# Patient Record
Sex: Male | Born: 1970 | Race: White | Hispanic: No | Marital: Married | Smoking: Never smoker
Health system: Southern US, Community
[De-identification: ages and names within clinical notes are randomized; demographics above are authoritative.]

## PROBLEM LIST (undated history)

## (undated) DIAGNOSIS — K219 Gastro-esophageal reflux disease without esophagitis: Secondary | ICD-10-CM

## (undated) DIAGNOSIS — Z87442 Personal history of urinary calculi: Secondary | ICD-10-CM

## (undated) DIAGNOSIS — I10 Essential (primary) hypertension: Secondary | ICD-10-CM

## (undated) DIAGNOSIS — N189 Chronic kidney disease, unspecified: Secondary | ICD-10-CM

## (undated) HISTORY — PX: THROAT SURGERY: SHX803

## (undated) HISTORY — DX: Essential (primary) hypertension: I10

---

## 2006-07-06 ENCOUNTER — Ambulatory Visit: Payer: Self-pay | Admitting: Family Medicine

## 2012-07-19 ENCOUNTER — Ambulatory Visit: Payer: Self-pay | Admitting: Otolaryngology

## 2012-07-20 LAB — PATHOLOGY REPORT

## 2012-08-23 ENCOUNTER — Ambulatory Visit: Payer: Self-pay | Admitting: Otolaryngology

## 2012-08-24 LAB — PATHOLOGY REPORT

## 2012-09-30 ENCOUNTER — Encounter: Payer: Self-pay | Admitting: Otolaryngology

## 2012-10-01 ENCOUNTER — Encounter: Payer: Self-pay | Admitting: Otolaryngology

## 2014-06-23 NOTE — Op Note (Signed)
PATIENT NAME:  Lowella PettiesBLALOCK, Divon T MR#:  [id number] DATE OF BIRTH:  09/18/1970  DATE OF PROCEDURE:  08/23/2012  PREOPERATIVE DIAGNOSIS: Laryngeal papillomas.   POSTOPERATIVE DIAGNOSIS: Laryngeal papillomas.   PROCEDURE PERFORMED:  Direct microlaryngoscopy and excision of laryngeal papillomas.   SURGEON: Cammy CopaPaul H. Kasmira Cacioppo, MD   ANESTHESIA: General.   COMPLICATIONS: None.   DESCRIPTION OF PROCEDURE: The patient was given general anesthesia by oral endotracheal intubation. A laser tube was placed. Once the patient was asleep, a laser Dedo laryngoscope was used for visualizing the hypopharynx and larynx. I was able to see the lower portion of the cords, but I was unable to get into the upper portion of the cords with the scope. The scope was removed and a Hollinger anterior commissure scope was then used for visualization of the anterior cords.  Because this was too narrow a scope, the laser was not used. The Louie arm was used for stabilization of the scope and then the microscope was brought in for high-power magnification. I could only use this with uniocular vision.   The right cord showed evidence of some scarring and healing tissue from removal of papillomas from the right cord 5 weeks ago. You could see papillomas on the left cord and some at the anterior commissure. The up-biting microcup forceps were used for excision of the papillomas on the left true cord. I took pictures of this prior to removal.  I first removed the ones that were easily seen. The cottonoid pledgets soaked in phenylephrine 1:1000 were used for vasoconstriction here in wiping away any blood staining. There was minimal bleeding, but slight ooze after biopsying the areas. Once it was vasoconstricted, the endoscope was used for visualizing the areas better. I could see still a little bit at the anterior commissure and then clean this out further. I used cottonoid pledgets again with phenylephrine for vasoconstriction for cleaning  this out and then looked again and could not find any further evidence of papillomas at the anterior commissure. The patient tolerated the procedure well. The scope was removed. There were no operative complications. Total estimated blood loss was minimal.   ____________________________ Cammy CopaPaul H. Zuzanna Maroney, MD phj:sb D: 08/23/2012 12:01:25 ET T: 08/23/2012 12:08:18 ET JOB#: [id number]  cc: Cammy CopaPaul H. Cherith Tewell, MD, <Dictator> Cammy CopaPAUL H Jodell Weitman MD ELECTRONICALLY SIGNED 08/23/2012 18:52

## 2014-06-23 NOTE — Op Note (Signed)
PATIENT NAME:  Isaac [name], Isaac [name] MR#:  [name] DATE OF BIRTH:  October 23, 1970  DATE OF PROCEDURE:  07/19/2012  PREOPERATIVE DIAGNOSIS: Right vocal cord lesions.  POSTOPERATIVE DIAGNOSES:  1.  Right vocal cord lesions, probable papilloma. 2.  Minimal left vocal cord lesions, probable papilloma.   OPERATIVE PROCEDURE: Direct microlaryngoscopy with biopsy and laser excision of the left vocal cord lesions.   SURGEON: Cammy CopaPaul H. Mayar [name], M.D.   ANESTHESIA: General.   COMPLICATIONS: None.   TOTAL ESTIMATED BLOOD LOSS: Minimal.   DESCRIPTION OF PROCEDURE:  The patient was given general anesthesia by oral endotracheal intubation. A Laser tube was used which is metal. Once the patient is asleep, placed in the spine position, his head is extended slightly and a Dedo laryngoscope was used for visualizing the hypopharynx and larynx. An upper tooth guard is placed as he has very good teeth. The Dedo scope is placed where the epiglottis is lifted upwards down to the level of the anterior commissure to be able to see the vocal cords. This was pretty tight to get into this area. A Louie Arm was used for stabilization. Microscope was brought in for high-power magnification and you could see what looked like papillomatous growths on the right vocal cord. There were a couple of small ones on the left anterior vocal cords just beyond the anterior commissure.  The right vocal cord lesions were at least twice the size and extended further posteriorly. A rigid 0 degree endoscope was used for better visualization, but I used a 30 degree scope to get even a better angle at the anterior commissure to take some pictures. The endoscope was then removed, microscope hooked back up.  Cup-biting forceps were used for biopsying and stripping some of the mucosa off of the anterior cord on the right side. The laser was set at 3 watts of power and 0.1 second interval mode.  This was tested to measure alignment of the HeNe beam. This  was correct. The laser was then brought in and the laser used along the anterior and midportion of the cords where the mucosa was stripped and clean and any rough edges along the mid anterior cord. This removed all aspects of the mucosa where there was any evidence of papilloma at the time. The muscle is left intact underneath. There is still some papilloma-appearing growth on the left anterior cord, however, this is not removed so as to prevent web formation. Bleeding was controlled with Cottonoid pledgets soaked in phenylephrine and Xylocaine. A wet Cottonoid that was soaked in saline was placed down over the laser tube and cuff to make sure that scatter did not hit these areas. Separate suction was used for suctioning out the vapor.  The patient tolerated the procedure very well. Once the papillomas were all removed from the right side, pictures were again taken with the endoscope for verification.   The patient was awakened and taken to the recovery room in satisfactory condition. There were no operative complications. Total estimated blood loss was minimal.  ____________________________ Cammy CopaPaul H. Isaac Dorsch, MD phj:sb D: 07/19/2012 13:37:20 ET [name]: 07/19/2012 14:19:28 ET JOB#: [id number]  cc: Cammy CopaPaul H. Isaac Rao, MD, <Dictator> Cammy CopaPAUL H Isaac Remick MD ELECTRONICALLY SIGNED 07/19/2012 19:46

## 2017-10-15 ENCOUNTER — Ambulatory Visit
Admission: EM | Admit: 2017-10-15 | Discharge: 2017-10-15 | Disposition: A | Discharge status: Home / Self Care | Payer: Self-pay | Attending: Family Medicine | Admitting: Family Medicine

## 2017-10-15 ENCOUNTER — Other Ambulatory Visit: Payer: Self-pay

## 2017-10-15 ENCOUNTER — Ambulatory Visit (INDEPENDENT_AMBULATORY_CARE_PROVIDER_SITE_OTHER): Payer: Self-pay

## 2017-10-15 ENCOUNTER — Encounter: Payer: Self-pay | Admitting: Emergency Medicine

## 2017-10-15 DIAGNOSIS — S76111A Strain of right quadriceps muscle, fascia and tendon, initial encounter: Secondary | ICD-10-CM

## 2017-10-15 MED ORDER — MUPIROCIN 2 % EX OINT: 1.0000 "application " | TOPICAL_OINTMENT | Freq: Three times a day (TID) | CUTANEOUS | 0 refills | Status: DC

## 2017-10-15 MED ORDER — NAPROXEN 500 MG PO TABS: 500.0000 mg | ORAL_TABLET | Freq: Two times a day (BID) | ORAL | 0 refills | Status: DC

## 2017-10-15 NOTE — ED Triage Notes (Signed)
Patient c/o right knee pain and swelling that started yesterday.  Patient denies fall or injury.

## 2017-10-15 NOTE — Discharge Instructions (Addendum)
Apply ice 20 minutes out of every 2 hours 4-5 times daily for comfort.  Use the knee immobilizer for all activities.  If not Improving in 1 to 2 weeks follow-up with orthopedic surgery.  Perform quadriceps strengthening exercises shown to you ,while wearing the brace.

## 2017-10-15 NOTE — ED Provider Notes (Signed)
[facility]    CSN: [id number] Arrival date & time: 10/15/17  1945     History   Chief Complaint Chief Complaint  Patient presents with  . Knee Pain    HPI REFUJIO HAYMER is a 47 y.o. male.   HPI  47 year old male accompanied by his wife presents complaining of right knee pain and swelling that started yesterday.  Able to localize the pain to the superior pole of the patella into the quadriceps tendon.  States that it hurts to bend the knee in extreme flexion also to take his body weight against gravity.  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  Able to take his knee through range of motion comfortably.  Does have warmth over the patella superiorly.  No popping locking or clicking.  Had no fever or chills.        History reviewed. No pertinent past medical history.  There are no active problems to display for this patient.   Past Surgical History:  Procedure Laterality Date  . THROAT SURGERY         Home Medications    Prior to Admission medications   Medication Sig Start Date End Date Taking? Authorizing Provider  mupirocin ointment (BACTROBAN) 2 % Apply 1 application topically 3 (three) times daily. 10/15/17   Lutricia Feil, PA-C  naproxen (NAPROSYN) 500 MG tablet Take 1 tablet (500 mg total) by mouth 2 (two) times daily with a meal. 10/15/17   Lutricia Feil, PA-C    Family History History reviewed. No pertinent family history.  Social History Social History   Tobacco Use  . Smoking status: Never Smoker  Substance Use Topics  . Alcohol use: Not Currently  . Drug use: Never     Allergies   Patient has no known allergies.   Review of Systems Review of Systems  Constitutional: Positive for activity change. Negative for appetite change, chills, fatigue and fever.  Musculoskeletal: Positive for gait problem and  myalgias.  All other systems reviewed and are negative.    Physical Exam Triage Vital Signs ED Triage Vitals  Enc Vitals Group     BP 10/15/17 1958 (!) 152/111     Pulse Rate 10/15/17 1958 86     Resp 10/15/17 1958 16     Temp 10/15/17 1958 98.2 F (36.8 C)     Temp Source 10/15/17 1958 Oral     SpO2 10/15/17 1958 98 %     Weight 10/15/17 1955 230 lb (104.3 kg)     Height 10/15/17 1955 5\' 10"  (1.778 m)     Head Circumference --      Peak Flow --      Pain Score 10/15/17 1955 8     Pain Loc --      Pain Edu? --      Excl. in GC? --    No data found.  Updated Vital Signs BP (!) 152/111 (BP Location: Left Arm)   Pulse 86   Temp 98.2 F (36.8 C) (Oral)   Resp 16   Ht 5\' 10"  (1.778 m)   Wt 230 lb (104.3 kg)   SpO2 98%   BMI 33.00 kg/m   Visual Acuity Right Eye Distance:   Left Eye Distance:   Bilateral Distance:    Right Eye Near:   Left Eye Near:    Bilateral  Near:     Physical Exam  Constitutional: He appears well-developed and well-nourished. No distress.  HENT:  Head: Normocephalic.  Eyes: Pupils are equal, round, and reactive to light. Right eye exhibits no discharge. Left eye exhibits no discharge.  Neck: Normal range of motion.  Musculoskeletal: He exhibits tenderness.  Exam of the right knee shows a full extension and flexion to 120 degrees.  There is no effusion.  Is very warm over the superior pole of the patella.  No noted pain with manipulation of the patella no retropatellar tenderness.  Pain is sharply localized and is exquisite over the insertion of the quadriceps tendon into the superior pole the patella.  He has no joint line tenderness.  Lateral collateral ligaments collateral ligament are intact to stressing.  He is a negative anterior drawer sign.  He does have a large calcification over the tibial tubercle from previous bursitis according to the patient.  Does have good quad control  Skin: He is not diaphoretic.  Nursing note and vitals  reviewed.    UC Treatments / Results  Labs (all labs ordered are listed, but only abnormal results are displayed) Labs Reviewed - No data to display  EKG None  Radiology Dg Knee Complete 4 Views Right  Result Date: 10/15/2017 CLINICAL DATA:  Right knee pain and swelling for 1 day. No known injury. EXAM: RIGHT KNEE - COMPLETE 4+ VIEW COMPARISON:  None. FINDINGS: No evidence of fracture, dislocation, or joint effusion. No evidence of arthropathy or other focal bone abnormality. Soft tissues are unremarkable. IMPRESSION: Negative. Electronically Signed   By: Myles RosenthalJohn  Stahl M.D.   On: 10/15/2017 20:20    Procedures Procedures (including critical care time)  Medications Ordered in UC Medications - No data to display  Initial Impression / Assessment and Plan / UC Course  I have reviewed the triage vital signs and the nursing notes.  Pertinent labs & imaging results that were available during my care of the patient were reviewed by me and considered in my medical decision making (see chart for details).     Plan: 1. Test/x-ray results and diagnosis reviewed with patient 2. rx as per orders; risks, benefits, potential side effects reviewed with patient 3. Recommend supportive treatment with ice and elevation.  Rest and symptom avoidance.  Avoid taking body weight against gravity.  Patient was given a knee immobilizer to wear for activities.  Come out of it for quiet times.  Naprosyn twice daily with food.  If he is not improving he should follow-up with orthopedics in 1 to 2 weeks 4. F/u prn if symptoms worsen or don't improve  Final Clinical Impressions(s) / UC Diagnoses   Final diagnoses:  Strain of right quadriceps tendon, initial encounter     Discharge Instructions     Apply ice 20 minutes out of every 2 hours 4-5 times daily for comfort.  Use the knee immobilizer for all activities.  If not Improving in 1 to 2 weeks follow-up with orthopedic surgery.  Perform quadriceps  strengthening exercises shown to you ,while wearing the brace.    ED Prescriptions    Medication Sig Dispense Auth. Provider   naproxen (NAPROSYN) 500 MG tablet Take 1 tablet (500 mg total) by mouth 2 (two) times daily with a meal. 60 tablet Ovid Curdoemer, Coburn Knaus P, PA-C   mupirocin ointment (BACTROBAN) 2 % Apply 1 application topically 3 (three) times daily. 22 g Lutricia Feiloemer, Shadi Larner P, PA-C     Controlled Substance Prescriptions Industry Controlled Substance Registry  consulted? Not Applicable   Lutricia FeilRoemer, Redmond Whittley P, PA-C 10/15/17 2049

## 2018-08-30 ENCOUNTER — Encounter: Payer: Self-pay | Admitting: Emergency Medicine

## 2018-08-30 ENCOUNTER — Ambulatory Visit
Admission: EM | Admit: 2018-08-30 | Discharge: 2018-08-30 | Disposition: A | Discharge status: Other Acute Inpatient Hospital | Payer: Self-pay | Attending: Emergency Medicine | Admitting: Emergency Medicine

## 2018-08-30 ENCOUNTER — Other Ambulatory Visit: Payer: Self-pay

## 2018-08-30 DIAGNOSIS — I160 Hypertensive urgency: Secondary | ICD-10-CM | POA: Insufficient documentation

## 2018-08-30 LAB — BASIC METABOLIC PANEL
Anion gap: 9 (ref 5–15)
BUN: 21 mg/dL — ABNORMAL HIGH (ref 6–20)
CO2: 20 mmol/L — ABNORMAL LOW (ref 22–32)
Calcium: 9.7 mg/dL (ref 8.9–10.3)
Chloride: 107 mmol/L (ref 98–111)
Creatinine, Ser: 1.47 mg/dL — ABNORMAL HIGH (ref 0.61–1.24)
GFR calc Af Amer: >60 mL/min (ref >60)
GFR calc non Af Amer: 56 mL/min — ABNORMAL LOW (ref >60)
Glucose, Bld: 104 mg/dL — ABNORMAL HIGH (ref 70–99)
Potassium: 4.2 mmol/L (ref 3.5–5.1)
Sodium: 136 mmol/L (ref 135–145)

## 2018-08-30 MED ORDER — CLONIDINE HCL 0.2 MG PO TABS
0.2000 mg | ORAL_TABLET | Freq: Once | ORAL | Status: AC
  Administered 2018-08-30: 0.2 mg via ORAL

## 2018-08-30 NOTE — Discharge Instructions (Signed)
Go directly to the emergency room for further evaluation as discussed.

## 2018-08-30 NOTE — ED Provider Notes (Addendum)
[facility] ____________________________________________  Time seen: Approximately 12:34 PM  I have reviewed the triage vital signs and the nursing notes.   HISTORY  Chief Complaint Hypertension   HPI Isaac [name] is a 48 y.o. male presenting for evaluation of elevated blood pressure.  Patient reports yesterday while at work he did not feel well, stating he was tired.  States today he had an episode of similar that lasted briefly and his friend checked his blood pressure and it was 145/100.  States at this time he feels well.  Denies any accompanying chest pain, shortness of breath, dizziness, paresthesias, headache, vision changes, extremity swelling or other changes.  Denies recent cough, congestion, sore throat or fevers.  Reports he has not followed with a primary care on a regular basis.  States he is currently working multiple jobs and does have a lot of stress.  States 18 years ago he had similar with a lot of stress in his blood pressure at that time was more elevated than now and he had a stress test and work-up which was negative.  States he wants to make sure he gets his blood pressure under control.  Reports family history of hypertension.  Not a smoker.  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  States overall his diet is fairly healthy.  Not drinking a lot of alcohol.  Drinks mostly water.  Denies any pain at this time.  Reports feels well.  No PCP.   History reviewed. No pertinent past medical history.  There are no active problems to display for this patient.   Past Surgical History:  Procedure Laterality Date  . THROAT SURGERY       No current facility-administered medications for this encounter.   Current Outpatient Medications:  .  mupirocin ointment (BACTROBAN) 2 %, Apply 1 application topically 3 (three) times daily., Disp: 22 g, Rfl: 0 .  naproxen (NAPROSYN) 500 MG tablet, Take 1 tablet (500 mg total) by mouth 2 (two) times  daily with a meal., Disp: 60 tablet, Rfl: 0  Allergies Patient has no known allergies.  Family History  Problem Relation Age of Onset  . Stroke Mother   . Hypertension Father   . Stroke Father   . Diabetes Father   Sister: HTN Father: MI, first at age 49 Father deceased.   Social History Social History   Tobacco Use  . Smoking status: Never Smoker  . Smokeless tobacco: Never Used  Substance Use Topics  . Alcohol use: Not Currently  . Drug use: Never    Review of Systems Constitutional: No fever Eyes: No visual changes. ENT: No sore throat. Cardiovascular: Denies chest pain. Respiratory: Denies shortness of breath. Gastrointestinal: No abdominal pain.  No nausea, no vomiting.  No diarrhea.  No constipation. Genitourinary: Negative for dysuria. Musculoskeletal: Negative for back pain. Skin: Negative for rash. Neurological: Negative for headaches, focal weakness or numbness.  ____________________________________________   PHYSICAL EXAM:  VITAL SIGNS: ED Triage Vitals  Enc Vitals Group     BP 08/30/18 1201 (!) 170/123     Pulse Rate 08/30/18 1201 81     Resp 08/30/18 1201 16     Temp 08/30/18 1201 99 F (37.2 C)     Temp Source 08/30/18 1201 Oral     SpO2 08/30/18 1201 100 %     Weight 08/30/18 1158 235 lb (106.6 kg)     Height 08/30/18 1158 5\' 9"  (1.753 m)     Head Circumference --  Peak Flow --      Pain Score 08/30/18 1157 0     Pain Loc --      Pain Edu? --      Excl. in GC? --    Vitals:   08/30/18 1158 08/30/18 1201 08/30/18 1328  BP:  (!) 170/123 140/90  Pulse:  81   Resp:  16   Temp:  99 F (37.2 C)   TempSrc:  Oral   SpO2:  100%   Weight: 235 lb (106.6 kg)    Height: 5\' 9"  (1.753 m)      Constitutional: Alert and oriented. Well appearing and in no acute distress. Eyes: Conjunctivae are normal.  ENT      Head: Normocephalic and atraumatic. Cardiovascular: Normal rate, regular rhythm. Grossly normal heart sounds.  Good peripheral  circulation. Respiratory: Normal respiratory effort without tachypnea nor retractions. Breath sounds are clear and equal bilaterally. No wheezes, rales, rhonchi. Musculoskeletal:  Steady gait.  No extremity edema noted. Neurologic:  Normal speech and language. No gross focal neurologic deficits are appreciated. Speech is normal. No gait instability.  Skin:  Skin is warm, dry and intact. No rash noted. Psychiatric: Mood and affect are normal. Speech and behavior are normal. Patient exhibits appropriate insight and judgment   ___________________________________________   LABS (all labs ordered are listed, but only abnormal results are displayed)  Labs Reviewed  BASIC METABOLIC PANEL - Abnormal; Notable for the following components:      Result Value   CO2 20 (*)    Glucose, Bld 104 (*)    BUN 21 (*)    Creatinine, Ser 1.47 (*)    GFR calc non Af Amer 56 (*)    All other components within normal limits   ECG ED ECG REPORT I, Renford DillsLindsey Mariska Daffin, the attending provider, personally viewed and interpreted this ECG.   Date: 08/30/2018  EKG Time: 1334  Rate: 68  Rhythm: normal sinus rhythm  Axis: normal  Intervals:none  ST&T Change: none   PROCEDURES Procedures    INITIAL IMPRESSION / ASSESSMENT AND PLAN / ED COURSE  Pertinent labs & imaging results that were available during my care of the patient were reviewed by me and considered in my medical decision making (see chart for details).  Well-appearing patient.  No acute distress.  Patient does report he is somewhat anxious currently about being in the doctor's office.  Denies chest pain, shortness of breath, headache or other complaints at this time.  Clonidine 0.2 mg given once and will evaluate BMP.  Patient resting calmly in room.  Blood pressure did improve with clonidine.  However BMP was evaluated and noted to have a creatinine elevation at 1.47.  No baseline available for comparison.  EKG evaluated.  As patient with  hypertension and possible organ involvement, hypertensive urgency and recommend further evaluation in emergency room at this time.  Patient with significant family risk factors.  Patient verbalized understanding agreed this plan, and states that his sister will [facility].  Patient agreed to plan.  Did further obtain outpatient follow-up appointment with [facility] on July 9 information was given by [facility] CMA for appointment.  ____________________________________________   FINAL CLINICAL IMPRESSION(S) / ED DIAGNOSES  Final diagnoses:  Hypertensive urgency     ED Discharge Orders    None       Note: This dictation was prepared with Dragon dictation along with smaller phrase technology. Any transcriptional errors that result from  this process are unintentional.         Marylene Land, NP 08/30/18 1405

## 2018-08-30 NOTE — ED Triage Notes (Signed)
Patient states that his BP had been elevated and when he checked it today it was 145/100.  Patient denies HAs.

## 2018-09-09 ENCOUNTER — Other Ambulatory Visit: Payer: Self-pay

## 2018-09-09 ENCOUNTER — Encounter: Payer: Self-pay | Admitting: Family Medicine

## 2018-09-09 ENCOUNTER — Ambulatory Visit (INDEPENDENT_AMBULATORY_CARE_PROVIDER_SITE_OTHER): Payer: Self-pay | Admitting: Family Medicine

## 2018-09-09 VITALS — BP 164/98 | HR 80 | Ht 69.0 in | Wt 228.0 lb

## 2018-09-09 DIAGNOSIS — I10 Essential (primary) hypertension: Secondary | ICD-10-CM

## 2018-09-09 DIAGNOSIS — E6609 Other obesity due to excess calories: Secondary | ICD-10-CM

## 2018-09-09 DIAGNOSIS — Z6833 Body mass index (BMI) 33.0-33.9, adult: Secondary | ICD-10-CM

## 2018-09-09 DIAGNOSIS — Z7689 Persons encountering health services in other specified circumstances: Secondary | ICD-10-CM

## 2018-09-09 MED ORDER — AMLODIPINE BESYLATE 2.5 MG PO TABS: 2.5000 mg | ORAL_TABLET | Freq: Every day | ORAL | 1 refills | Status: DC

## 2018-09-09 NOTE — Progress Notes (Signed)
Date:  09/09/2018   Name:  Isaac PettiesJonathan T [name]   DOB:  Apr 24, 1970   MRN:  [id number]   Chief Complaint: Establish Care (no PCP) and Hypertension (episode occured last monday- happened 18 years ago. dx as stress related because "everything showed up fine")  Hypertension This is a chronic problem. The current episode started more than 1 year ago. The problem has been waxing and waning since onset. The problem is uncontrolled. Pertinent negatives include no anxiety, blurred vision, chest pain, headaches, malaise/fatigue, neck pain, orthopnea, palpitations, peripheral edema, PND, shortness of breath or sweats. There are no associated agents to hypertension. There are no known risk factors for coronary artery disease. Past treatments include nothing. The current treatment provides moderate improvement. There are no compliance problems.  There is no history of angina, kidney disease, CAD/MI, CVA, heart failure, left ventricular hypertrophy, PVD or retinopathy. There is no history of chronic renal disease, a hypertension causing med or renovascular disease.    Review of Systems  Constitutional: Negative for chills, fever and malaise/fatigue.  HENT: Negative for drooling, ear discharge, ear pain and sore throat.   Eyes: Negative for blurred vision.  Respiratory: Negative for cough, shortness of breath and wheezing.   Cardiovascular: Negative for chest pain, palpitations, orthopnea, leg swelling and PND.  Gastrointestinal: Negative for abdominal pain, blood in stool, constipation, diarrhea and nausea.  Endocrine: Negative for polydipsia.  Genitourinary: Negative for dysuria, frequency, hematuria and urgency.  Musculoskeletal: Negative for back pain, myalgias and neck pain.  Skin: Negative for rash.  Allergic/Immunologic: Negative for environmental allergies.  Neurological: Negative for dizziness and headaches.  Hematological: Does not bruise/bleed easily.  Psychiatric/Behavioral: Negative for  suicidal ideas. The patient is not nervous/anxious.     There are no active problems to display for this patient.   No Known Allergies  Past Surgical History:  Procedure Laterality Date  . THROAT SURGERY      Social History   Tobacco Use  . Smoking status: Never Smoker  . Smokeless tobacco: Never Used  Substance Use Topics  . Alcohol use: Not Currently  . Drug use: Never     Medication list has been reviewed and updated.  No outpatient medications have been marked as taking for the 09/09/18 encounter (Office Visit) with Duanne LimerickJones, Wood Novacek C, MD.    [facility]2018  PHQ - 2 Score 0  PHQ- 9 Score 2    BP Readings from Last 3 Encounters:  09/09/18 (!) 164/98  08/30/18 140/90  10/15/17 (!) 152/111    Physical Exam Vitals signs and nursing note reviewed.  Constitutional:      General: He is not in acute distress.    Appearance: Normal appearance. He is normal weight. He is not ill-appearing, toxic-appearing or diaphoretic.  HENT:     Head: Normocephalic.     Right Ear: Tympanic membrane, ear canal and external ear normal.     Left Ear: Tympanic membrane, ear canal and external ear normal.     Nose: Nose normal. No congestion or rhinorrhea.     Mouth/Throat:     Mouth: Mucous membranes are moist.  Eyes:     General: No scleral icterus.       Right eye: No discharge.        Left eye: No discharge.     Conjunctiva/sclera: Conjunctivae normal.     Pupils: Pupils are equal, round, and reactive to light.  Neck:     Musculoskeletal: Normal range of motion and  neck supple.     Thyroid: No thyromegaly.     Vascular: No JVD.     Trachea: No tracheal deviation.  Cardiovascular:     Rate and Rhythm: Normal rate and regular rhythm.     Pulses: Normal pulses.     Heart sounds: Normal heart sounds. No murmur. No friction rub. No gallop.   Pulmonary:     Effort: No respiratory distress.     Breath sounds: Normal breath sounds. No stridor. No wheezing, rhonchi or  rales.  Chest:     Chest wall: No tenderness.  Abdominal:     General: Bowel sounds are normal.     Palpations: Abdomen is soft. There is no mass.     Tenderness: There is no abdominal tenderness. There is no guarding or rebound.  Musculoskeletal: Normal range of motion.        General: No tenderness.  Lymphadenopathy:     Cervical: No cervical adenopathy.  Skin:    General: Skin is warm.     Coloration: Skin is not jaundiced or pale.     Findings: No bruising, erythema, lesion or rash.  Neurological:     Mental Status: He is alert and oriented to person, place, and time.     Cranial Nerves: No cranial nerve deficit.     Deep Tendon Reflexes: Reflexes are normal and symmetric.     Wt Readings from Last 3 Encounters:  09/09/18 228 lb (103.4 kg)  08/30/18 235 lb (106.6 kg)  10/15/17 230 lb (104.3 kg)    BP (!) 164/98   Pulse 80   Ht 5\' 9"  (1.753 m)   Wt 228 lb (103.4 kg)   BMI 33.67 kg/m   Assessment and Plan: Patient has had multiple readings of elevated blood pressures on other visits.  Before these readings have been watched 1. Essential hypertension Patient has had multiple readings of elevated blood pressures on other basis.  Before these readings have been "watch ".  On a previous visit to the urgent care patient was noted to have elevated blood pressure reading which necessitated a ER evaluation.  Patient was noted to have some prerenal azotemia which was corrected with hydration.  We will initiate amlodipine 2.5 mg to see how patient tolerates and evaluate in 6 weeks.  In the meantime we will also incorporate a Dash diet and encourage avoidance of sodium in abundance. - amLODipine (NORVASC) 2.5 MG tablet; Take 1 tablet (2.5 mg total) by mouth daily.  Dispense: 30 tablet; Refill: 1  2. Class 1 obesity due to excess calories with serious comorbidity and body mass index (BMI) of 33.0 to 33.9 in adult Patient could use some weight loss as well and the DASH diet may serve  in this purpose and capacity as well.  3. Establishing care with new doctor, encounter for Patient establishing care with new physician for primary care needs.

## 2018-09-09 NOTE — Patient Instructions (Signed)

## 2018-10-21 ENCOUNTER — Encounter: Payer: Self-pay | Admitting: Family Medicine

## 2018-10-21 ENCOUNTER — Ambulatory Visit (INDEPENDENT_AMBULATORY_CARE_PROVIDER_SITE_OTHER): Payer: Self-pay | Admitting: Family Medicine

## 2018-10-21 ENCOUNTER — Other Ambulatory Visit: Payer: Self-pay

## 2018-10-21 VITALS — BP 136/82 | HR 76 | Ht 69.0 in | Wt 225.0 lb

## 2018-10-21 DIAGNOSIS — I10 Essential (primary) hypertension: Secondary | ICD-10-CM

## 2018-10-21 MED ORDER — AMLODIPINE BESYLATE 2.5 MG PO TABS: 2.5000 mg | ORAL_TABLET | Freq: Every day | ORAL | 1 refills | Status: DC

## 2018-10-21 NOTE — Progress Notes (Signed)
Date:  10/21/2018   Name:  Isaac [name]   DOB:  [birth year]   MRN:  [id number]   Chief Complaint: Hypertension (follow up starting Amlodipine)  Hypertension This is a chronic problem. The current episode started more than 1 year ago. The problem has been waxing and waning since onset. The problem is controlled. Pertinent negatives include no anxiety, blurred vision, chest pain, headaches, malaise/fatigue, neck pain, orthopnea, palpitations, peripheral edema, PND, shortness of breath or sweats. There are no associated agents to hypertension. There are no known risk factors for coronary artery disease. Past treatments include calcium channel blockers. The current treatment provides moderate improvement. There are no compliance problems.  There is no history of angina, kidney disease, CAD/MI, CVA, heart failure, left ventricular hypertrophy, PVD or retinopathy. There is no history of chronic renal disease, a hypertension causing med or renovascular disease.    Review of Systems  Constitutional: Negative for chills, fever and malaise/fatigue.  HENT: Negative for drooling, ear discharge, ear pain and sore throat.   Eyes: Negative for blurred vision.  Respiratory: Negative for cough, shortness of breath and wheezing.   Cardiovascular: Negative for chest pain, palpitations, orthopnea, leg swelling and PND.  Gastrointestinal: Negative for abdominal pain, blood in stool, constipation, diarrhea and nausea.  Endocrine: Negative for polydipsia.  Genitourinary: Negative for dysuria, frequency, hematuria and urgency.  Musculoskeletal: Negative for back pain, myalgias and neck pain.  Skin: Negative for rash.  Allergic/Immunologic: Negative for environmental allergies.  Neurological: Negative for dizziness and headaches.  Hematological: Does not bruise/bleed easily.  Psychiatric/Behavioral: Negative for suicidal ideas. The patient is not nervous/anxious.     There are no active problems to  display for this patient.   No Known Allergies  Past Surgical History:  Procedure Laterality Date  . THROAT SURGERY      Social History   Tobacco Use  . Smoking status: Never Smoker  . Smokeless tobacco: Never Used  Substance Use Topics  . Alcohol use: Not Currently  . Drug use: Never     Medication list has been reviewed and updated.  Current Meds  Medication Sig  . amLODipine (NORVASC) 2.5 MG tablet Take 1 tablet (2.5 mg total) by mouth daily.    PHQ 2/9 Scores 09/09/2018  PHQ - 2 Score 0  PHQ- 9 Score 2    BP Readings from Last 3 Encounters:  10/21/18 136/82  09/09/18 (!) 164/98  08/30/18 140/90    Physical Exam Vitals signs and nursing note reviewed.  HENT:     Head: Normocephalic.     Right Ear: Tympanic membrane, ear canal and external ear normal.     Left Ear: Tympanic membrane, ear canal and external ear normal.     Nose: Nose normal. No congestion or rhinorrhea.  Eyes:     General: No scleral icterus.       Right eye: No discharge.        Left eye: No discharge.     Conjunctiva/sclera: Conjunctivae normal.     Pupils: Pupils are equal, round, and reactive to light.  Neck:     Musculoskeletal: Normal range of motion and neck supple.     Thyroid: No thyromegaly.     Vascular: No JVD.     Trachea: No tracheal deviation.  Cardiovascular:     Rate and Rhythm: Normal rate and regular rhythm.     Heart sounds: Normal heart sounds. No murmur. No friction rub. No gallop.   Pulmonary:  Effort: No respiratory distress.     Breath sounds: Normal breath sounds. No wheezing, rhonchi or rales.  Abdominal:     General: Bowel sounds are normal.     Palpations: Abdomen is soft. There is no mass.     Tenderness: There is no abdominal tenderness. There is no guarding or rebound.  Musculoskeletal: Normal range of motion.        General: No tenderness.  Lymphadenopathy:     Cervical: No cervical adenopathy.  Skin:    General: Skin is warm.     Findings: No  rash.  Neurological:     Mental Status: He is alert and oriented to person, place, and time.     Cranial Nerves: No cranial nerve deficit.     Deep Tendon Reflexes: Reflexes are normal and symmetric.     Wt Readings from Last 3 Encounters:  10/21/18 225 lb (102.1 kg)  09/09/18 228 lb (103.4 kg)  08/30/18 235 lb (106.6 kg)    BP 136/82   Pulse 76   Ht 5\' 9"  (1.753 m)   Wt 225 lb (102.1 kg)   BMI 33.23 kg/m   Assessment and Plan: 1. Essential hypertension Chronic.  Controlled.  Patient doing well symptomatically as well as control of his blood pressure on amlodipine 2.5 mg once a day.  We will continue this at this time and will recheck in 6 months.  We will obtain a renal function panel to assess improvement on GFR. - amLODipine (NORVASC) 2.5 MG tablet; Take 1 tablet (2.5 mg total) by mouth daily.  Dispense: 90 tablet; Refill: 1 - Renal Function Panel

## 2018-10-22 LAB — RENAL FUNCTION PANEL
Albumin: 5 g/dL (ref 4.0–5.0)
BUN/Creatinine Ratio: 13 (ref 9–20)
BUN: 20 mg/dL (ref 6–24)
CO2: 20 mmol/L (ref 20–29)
Calcium: 10.1 mg/dL (ref 8.7–10.2)
Chloride: 101 mmol/L (ref 96–106)
Creatinine, Ser: 1.56 mg/dL — ABNORMAL HIGH (ref 0.76–1.27)
GFR calc Af Amer: 60 mL/min/{1.73_m2} (ref >59)
GFR calc non Af Amer: 52 mL/min/{1.73_m2} — ABNORMAL LOW (ref >59)
Glucose: 85 mg/dL (ref 65–99)
Phosphorus: 3.9 mg/dL (ref 2.8–4.1)
Potassium: 4.6 mmol/L (ref 3.5–5.2)
Sodium: 140 mmol/L (ref 134–144)

## 2019-05-01 IMAGING — CR DG KNEE COMPLETE 4+V*R*
5 series · 5 of 5 positions shown · non-contrast
Comparison: None.

CLINICAL DATA: Right knee pain and swelling for 1 day. No known
injury.

EXAM:
RIGHT KNEE - COMPLETE 4+ VIEW

[knee ap (1 of 4)]
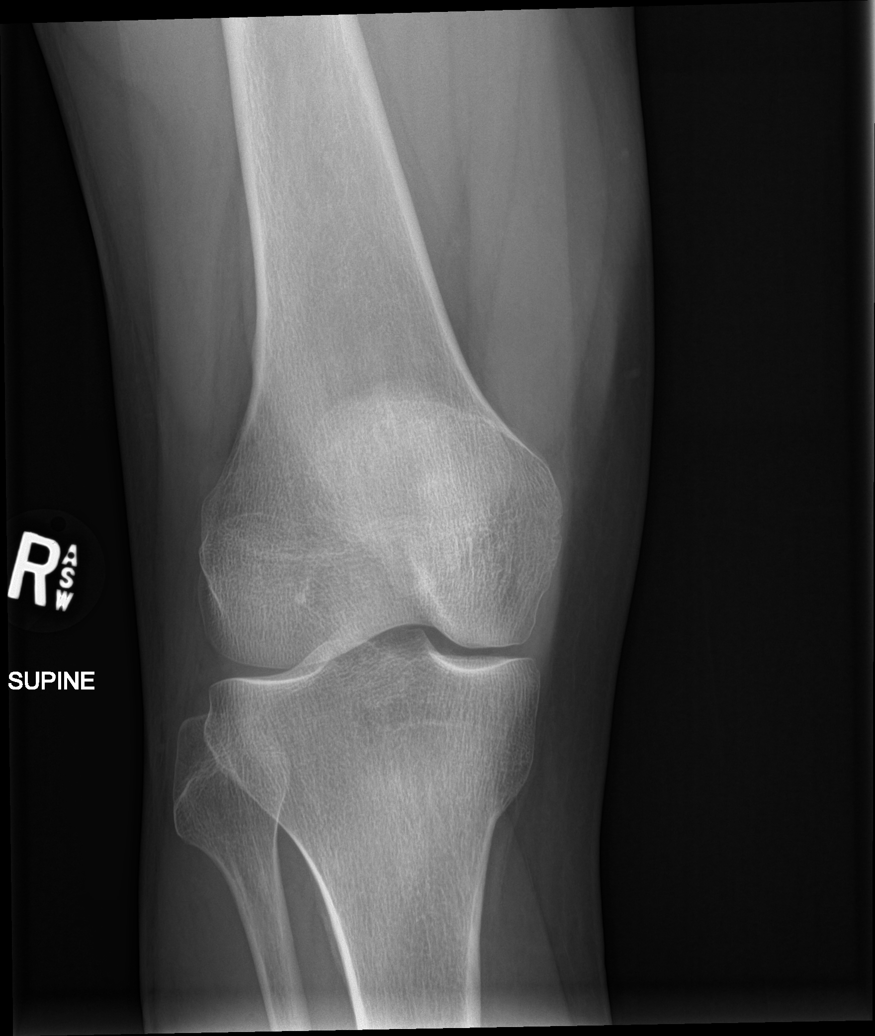

[tunnel]
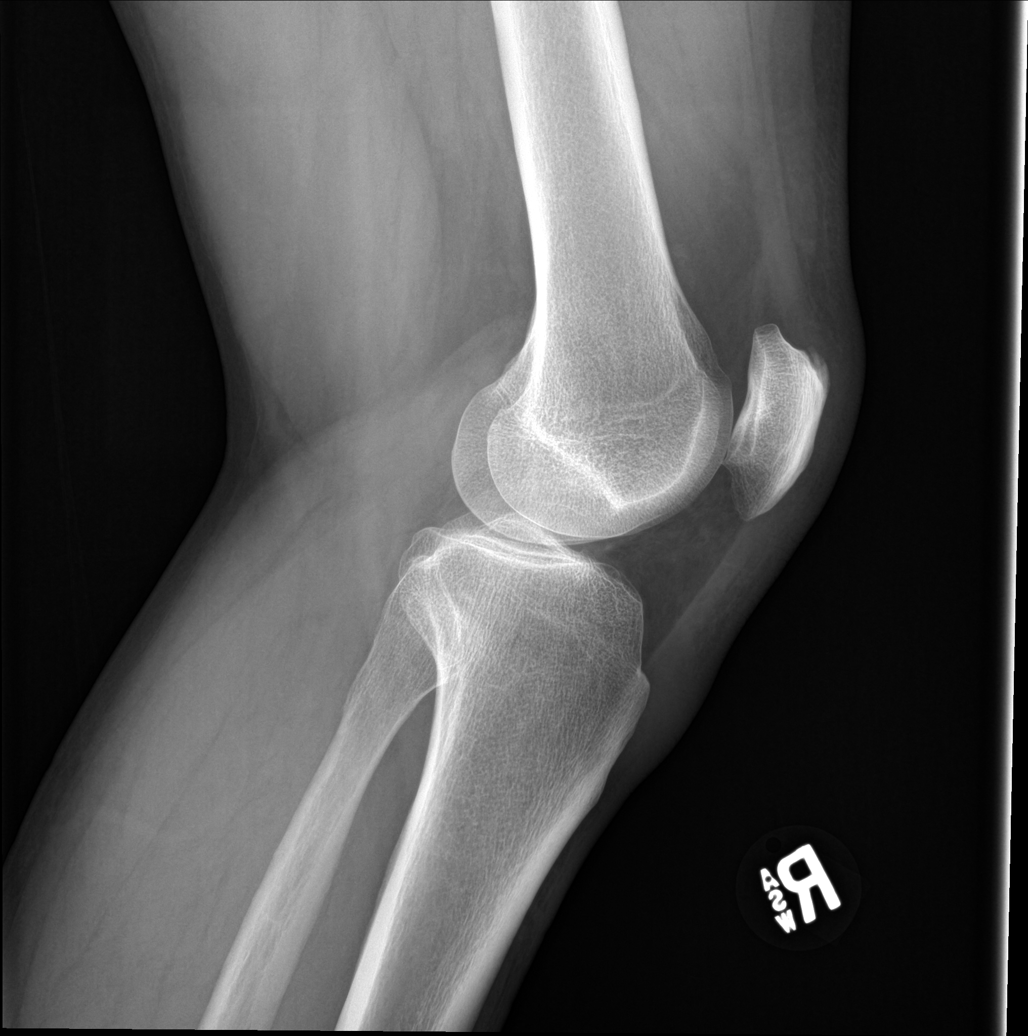

[knee ap (2 of 4)]
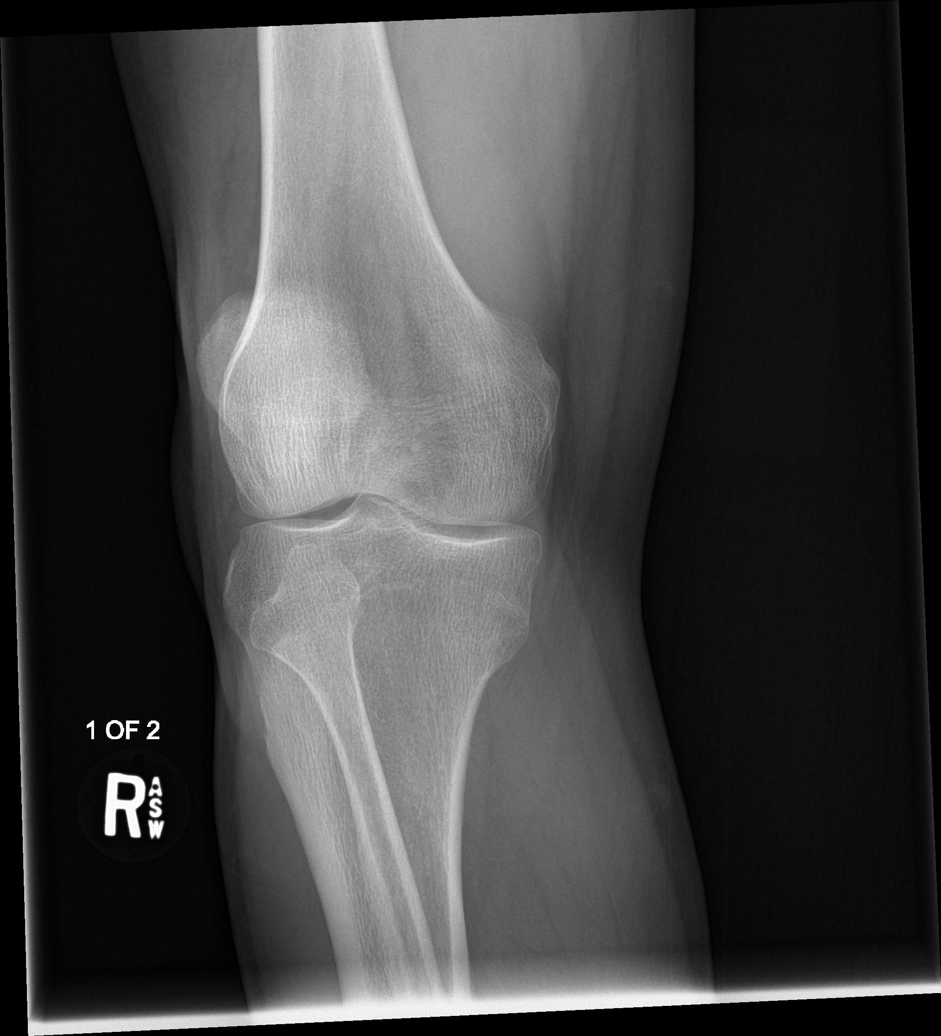

[knee ap (3 of 4)]
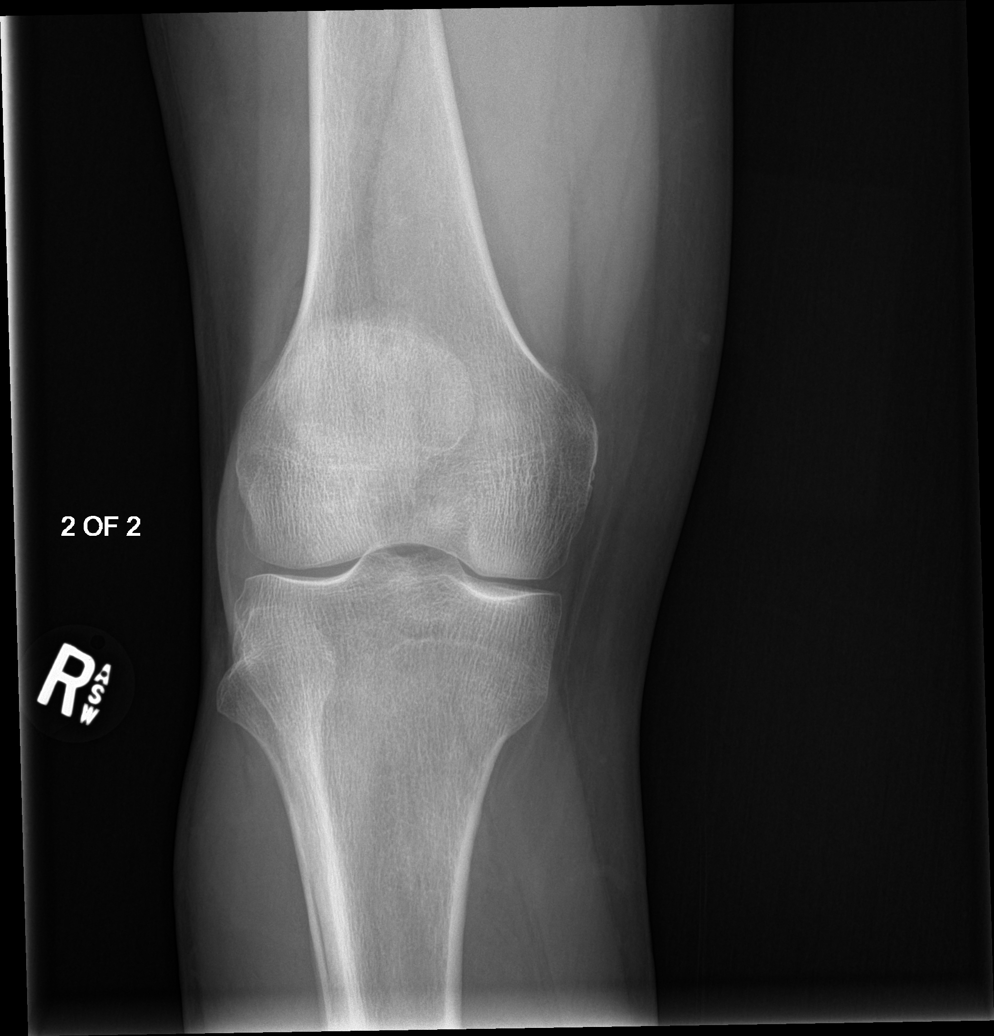

[knee ap (4 of 4)]
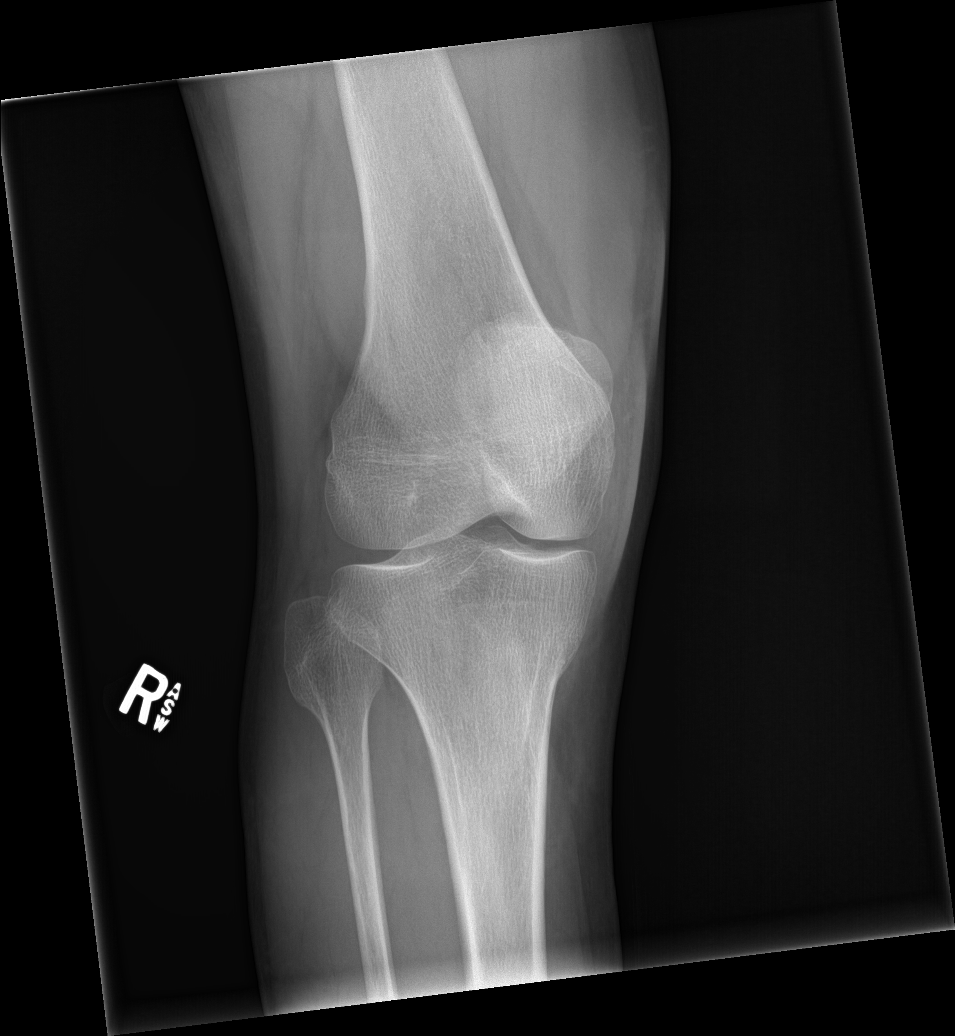

[5 of 5 positions shown; findings below may reference images not displayed]

FINDINGS: No evidence of fracture, dislocation, or joint effusion. No evidence
of arthropathy or other focal bone abnormality. Soft tissues are
unremarkable.
IMPRESSION: Negative.

## 2019-05-17 ENCOUNTER — Ambulatory Visit (INDEPENDENT_AMBULATORY_CARE_PROVIDER_SITE_OTHER): Payer: Self-pay | Admitting: Family Medicine

## 2019-05-17 ENCOUNTER — Other Ambulatory Visit: Payer: Self-pay

## 2019-05-17 ENCOUNTER — Encounter: Payer: Self-pay | Admitting: Family Medicine

## 2019-05-17 VITALS — BP 140/90 | HR 72 | Ht 69.0 in | Wt 236.0 lb

## 2019-05-17 DIAGNOSIS — I10 Essential (primary) hypertension: Secondary | ICD-10-CM

## 2019-05-17 DIAGNOSIS — Z23 Encounter for immunization: Secondary | ICD-10-CM

## 2019-05-17 MED ORDER — AMLODIPINE BESYLATE 5 MG PO TABS: 5.0000 mg | ORAL_TABLET | Freq: Every day | ORAL | 1 refills | Status: DC

## 2019-05-17 NOTE — Addendum Note (Signed)
Addended by: Everitt Amber on: 05/17/2019 02:46 PM   Modules accepted: Orders

## 2019-05-17 NOTE — Patient Instructions (Signed)

## 2019-05-17 NOTE — Progress Notes (Signed)
Date:  05/17/2019   Name:  Isaac [name]   DOB:  1970/09/04   MRN:  [id number]   Chief Complaint: Hypertension and tdap need  Hypertension This is a chronic problem. The current episode started more than 1 year ago. The problem has been gradually improving since onset. The problem is controlled. Pertinent negatives include no anxiety, blurred vision, chest pain, headaches, malaise/fatigue, neck pain, orthopnea, palpitations, peripheral edema, PND, shortness of breath or sweats. There are no associated agents to hypertension. The current treatment provides moderate improvement. There are no compliance problems.  There is no history of angina, kidney disease, CAD/MI, CVA, heart failure, left ventricular hypertrophy, PVD or retinopathy. There is no history of chronic renal disease, a hypertension causing med or renovascular disease.    Lab Results  Component Value Date   CREATININE 1.56 (H) 10/21/2018   BUN 20 10/21/2018   NA 140 10/21/2018   K 4.6 10/21/2018   CL 101 10/21/2018   CO2 20 10/21/2018   No results found for: CHOL, HDL, LDLCALC, LDLDIRECT, TRIG, CHOLHDL No results found for: TSH No results found for: HGBA1C No results found for: WBC, HGB, HCT, MCV, PLT No results found for: ALT, AST, GGT, ALKPHOS, BILITOT   Review of Systems  Constitutional: Negative for chills, fever and malaise/fatigue.  HENT: Negative for drooling, ear discharge, ear pain and sore throat.   Eyes: Negative for blurred vision.  Respiratory: Negative for cough, shortness of breath and wheezing.   Cardiovascular: Negative for chest pain, palpitations, orthopnea, leg swelling and PND.  Gastrointestinal: Negative for abdominal pain, blood in stool, constipation, diarrhea and nausea.  Endocrine: Negative for polydipsia.  Genitourinary: Negative for dysuria, frequency, hematuria and urgency.  Musculoskeletal: Negative for back pain, myalgias and neck pain.  Skin: Negative for rash.    Allergic/Immunologic: Negative for environmental allergies.  Neurological: Negative for dizziness and headaches.  Hematological: Does not bruise/bleed easily.  Psychiatric/Behavioral: Negative for suicidal ideas. The patient is not nervous/anxious.     There are no problems to display for this patient.   No Known Allergies  Past Surgical History:  Procedure Laterality Date  . THROAT SURGERY      Social History   Tobacco Use  . Smoking status: Never Smoker  . Smokeless tobacco: Never Used  Substance Use Topics  . Alcohol use: Not Currently  . Drug use: Never     Medication list has been reviewed and updated.  Current Meds  Medication Sig  . amLODipine (NORVASC) 2.5 MG tablet Take 1 tablet (2.5 mg total) by mouth daily.    PHQ 2/9 Scores 05/17/2019 09/09/2018  PHQ - 2 Score 0 0  PHQ- 9 Score 0 2    BP Readings from Last 3 Encounters:  05/17/19 140/90  10/21/18 136/82  09/09/18 (!) 164/98    Physical Exam Vitals and nursing note reviewed.  HENT:     Head: Normocephalic.     Right Ear: Tympanic membrane, ear canal and external ear normal.     Left Ear: Tympanic membrane, ear canal and external ear normal.     Nose: Nose normal.  Eyes:     General: No scleral icterus.       Right eye: No discharge.        Left eye: No discharge.     Conjunctiva/sclera: Conjunctivae normal.     Pupils: Pupils are equal, round, and reactive to light.  Neck:     Thyroid: No thyromegaly.  Vascular: No JVD.     Trachea: No tracheal deviation.  Cardiovascular:     Rate and Rhythm: Normal rate and regular rhythm.     Heart sounds: Normal heart sounds, S1 normal and S2 normal. No murmur. No systolic murmur. No diastolic murmur. No friction rub. No gallop. No S3 or S4 sounds.   Pulmonary:     Effort: No respiratory distress.     Breath sounds: Normal breath sounds. No wheezing, rhonchi or rales.  Abdominal:     General: Bowel sounds are normal.     Palpations: Abdomen is  soft. There is no mass.     Tenderness: There is no abdominal tenderness. There is no guarding or rebound.  Musculoskeletal:        General: No tenderness. Normal range of motion.     Cervical back: Normal range of motion and neck supple.     Right lower leg: No edema.     Left lower leg: No edema.  Lymphadenopathy:     Cervical: No cervical adenopathy.  Skin:    General: Skin is warm.     Findings: No rash.  Neurological:     Mental Status: He is alert and oriented to person, place, and time.     Cranial Nerves: No cranial nerve deficit.     Deep Tendon Reflexes: Reflexes are normal and symmetric.     Wt Readings from Last 3 Encounters:  05/17/19 236 lb (107 kg)  10/21/18 225 lb (102.1 kg)  09/09/18 228 lb (103.4 kg)    BP 140/90   Pulse 72   Ht 5\' 9"  (1.753 m)   Wt 236 lb (107 kg)   BMI 34.85 kg/m   Assessment and Plan:  1. Essential hypertension:                Chronic.  Uncontrolled.  Stable.  Amlodipine will be increased to 5 mg once a day.  We will also obtain a renal function panel stating that the GFR was down to 50.  We will recheck patient in 6 months. - amLODipine (NORVASC) 5 MG tablet; Take 1 tablet (5 mg total) by mouth daily.  Dispense: 90 tablet; Refill: 1 - Renal Function Panel

## 2019-05-18 LAB — RENAL FUNCTION PANEL
Albumin: 4.9 g/dL (ref 4.0–5.0)
BUN/Creatinine Ratio: 11 (ref 9–20)
BUN: 17 mg/dL (ref 6–24)
CO2: 22 mmol/L (ref 20–29)
Calcium: 9.7 mg/dL (ref 8.7–10.2)
Chloride: 101 mmol/L (ref 96–106)
Creatinine, Ser: 1.52 mg/dL — ABNORMAL HIGH (ref 0.76–1.27)
GFR calc Af Amer: 61 mL/min/{1.73_m2} (ref >59)
GFR calc non Af Amer: 53 mL/min/{1.73_m2} — ABNORMAL LOW (ref >59)
Glucose: 95 mg/dL (ref 65–99)
Phosphorus: 3.5 mg/dL (ref 2.8–4.1)
Potassium: 4.4 mmol/L (ref 3.5–5.2)
Sodium: 139 mmol/L (ref 134–144)

## 2019-10-20 ENCOUNTER — Encounter: Payer: Self-pay | Admitting: Family Medicine

## 2019-11-17 ENCOUNTER — Other Ambulatory Visit: Payer: Self-pay

## 2019-11-17 ENCOUNTER — Ambulatory Visit (INDEPENDENT_AMBULATORY_CARE_PROVIDER_SITE_OTHER): Payer: Self-pay | Admitting: Family Medicine

## 2019-11-17 ENCOUNTER — Encounter: Payer: Self-pay | Admitting: Family Medicine

## 2019-11-17 VITALS — BP 130/98 | HR 64 | Ht 69.0 in | Wt 230.0 lb

## 2019-11-17 DIAGNOSIS — N1831 Chronic kidney disease, stage 3a: Secondary | ICD-10-CM

## 2019-11-17 DIAGNOSIS — I10 Essential (primary) hypertension: Secondary | ICD-10-CM

## 2019-11-17 MED ORDER — AMLODIPINE BESYLATE 10 MG PO TABS: 10.0000 mg | ORAL_TABLET | Freq: Every day | ORAL | 3 refills | Status: DC

## 2019-11-17 NOTE — Progress Notes (Signed)
Date:  11/17/2019   Name:  Isaac [name]   DOB:  02-08-1971   MRN:  [id number]   Chief Complaint: Hypertension  Hypertension This is a chronic problem. The current episode started more than 1 year ago. The problem has been waxing and waning since onset. The problem is controlled. Pertinent negatives include no anxiety, blurred vision, chest pain, headaches, malaise/fatigue, neck pain, orthopnea, palpitations, peripheral edema, PND, shortness of breath or sweats. There are no associated agents to hypertension. There are no known risk factors for coronary artery disease. Past treatments include calcium channel blockers. There are no compliance problems.  There is no history of angina, kidney disease, CAD/MI, CVA, heart failure, left ventricular hypertrophy, PVD or retinopathy. There is no history of chronic renal disease or a hypertension causing med.    Lab Results  Component Value Date   CREATININE 1.52 (H) 05/17/2019   BUN 17 05/17/2019   NA 139 05/17/2019   K 4.4 05/17/2019   CL 101 05/17/2019   CO2 22 05/17/2019   No results found for: CHOL, HDL, LDLCALC, LDLDIRECT, TRIG, CHOLHDL No results found for: TSH No results found for: HGBA1C No results found for: WBC, HGB, HCT, MCV, PLT No results found for: ALT, AST, GGT, ALKPHOS, BILITOT   Review of Systems  Constitutional: Negative for chills, fever and malaise/fatigue.  HENT: Negative for drooling, ear discharge, ear pain and sore throat.   Eyes: Negative for blurred vision.  Respiratory: Negative for cough, shortness of breath and wheezing.   Cardiovascular: Negative for chest pain, palpitations, orthopnea, leg swelling and PND.  Gastrointestinal: Negative for abdominal pain, blood in stool, constipation, diarrhea and nausea.  Endocrine: Negative for polydipsia.  Genitourinary: Negative for dysuria, frequency, hematuria and urgency.  Musculoskeletal: Negative for back pain, myalgias and neck pain.  Skin: Negative for  rash.  Allergic/Immunologic: Negative for environmental allergies.  Neurological: Negative for dizziness and headaches.  Hematological: Does not bruise/bleed easily.  Psychiatric/Behavioral: Negative for suicidal ideas. The patient is not nervous/anxious.     There are no problems to display for this patient.   No Known Allergies  Past Surgical History:  Procedure Laterality Date  . THROAT SURGERY      Social History   Tobacco Use  . Smoking status: Never Smoker  . Smokeless tobacco: Never Used  Vaping Use  . Vaping Use: Never used  Substance Use Topics  . Alcohol use: Not Currently  . Drug use: Never     Medication list has been reviewed and updated.  Current Meds  Medication Sig  . amLODipine (NORVASC) 5 MG tablet Take 1 tablet (5 mg total) by mouth daily.    PHQ 2/9 Scores 11/17/2019 05/17/2019 09/09/2018  PHQ - 2 Score 0 0 0  PHQ- 9 Score 0 0 2    GAD 7 : Generalized Anxiety Score 11/17/2019 05/17/2019  Nervous, Anxious, on Edge 0 0  Control/stop worrying 0 0  Worry too much - different things 0 0  Trouble relaxing 0 0  Restless 0 0  Easily annoyed or irritable 0 0  Afraid - awful might happen 0 0  Total GAD 7 Score 0 0    BP Readings from Last 3 Encounters:  11/17/19 (!) 130/98  05/17/19 140/90  10/21/18 136/82    Physical Exam Vitals and nursing note reviewed.  HENT:     Head: Normocephalic.     Right Ear: Tympanic membrane and external ear normal.     Left Ear: Tympanic  membrane and external ear normal.     Nose: Nose normal.  Eyes:     General: No scleral icterus.       Right eye: No discharge.        Left eye: No discharge.     Conjunctiva/sclera: Conjunctivae normal.     Pupils: Pupils are equal, round, and reactive to light.  Neck:     Thyroid: No thyromegaly.     Vascular: No JVD.     Trachea: No tracheal deviation.  Cardiovascular:     Rate and Rhythm: Normal rate and regular rhythm.     Heart sounds: Normal heart sounds and S1  normal. No murmur heard.  No systolic murmur is present.  No diastolic murmur is present.  No friction rub. No gallop. No S3 or S4 sounds.   Pulmonary:     Effort: No respiratory distress.     Breath sounds: Normal breath sounds. No wheezing or rales.  Abdominal:     General: Bowel sounds are normal.     Palpations: Abdomen is soft. There is no mass.     Tenderness: There is no abdominal tenderness. There is no guarding or rebound.  Musculoskeletal:        General: No tenderness. Normal range of motion.     Cervical back: Normal range of motion and neck supple.  Lymphadenopathy:     Cervical: No cervical adenopathy.  Skin:    General: Skin is warm.     Findings: No rash.  Neurological:     Mental Status: He is alert and oriented to person, place, and time.     Cranial Nerves: No cranial nerve deficit.     Deep Tendon Reflexes: Reflexes are normal and symmetric.     Wt Readings from Last 3 Encounters:  11/17/19 230 lb (104.3 kg)  05/17/19 236 lb (107 kg)  10/21/18 225 lb (102.1 kg)    BP (!) 130/98   Pulse 64   Ht 5\' 9"  (1.753 m)   Wt 230 lb (104.3 kg)   BMI 33.97 kg/m   Assessment and Plan: 1. Essential hypertension Chronic.  Uncontrolled.  Stable.  Systolic readings are approaching normal but there is still elevation of the diastolic readings.  We will increase amlodipine to 10 mg once a day.  Patient will return in 3 months for recheck.  We will recheck renal function panel because of mild renal insufficiency noted previous exam. - Renal Function Panel  2. Stage 3a chronic kidney disease As noted on previous renal function panels GFR in the 50-59 range.  We will also check lipid panel to make sure that this is not a contributing cause due to atherosclerosis due to hyperlipidemia. - Renal Function Panel - Lipid Panel With LDL/HDL Ratio

## 2019-11-18 LAB — LIPID PANEL WITH LDL/HDL RATIO
Cholesterol, Total: 190 mg/dL (ref 100–199)
HDL: 31 mg/dL — ABNORMAL LOW (ref >39)
LDL Chol Calc (NIH): 115 mg/dL — ABNORMAL HIGH (ref 0–99)
LDL/HDL Ratio: 3.7 ratio — ABNORMAL HIGH (ref 0.0–3.6)
Triglycerides: 251 mg/dL — ABNORMAL HIGH (ref 0–149)
VLDL Cholesterol Cal: 44 mg/dL — ABNORMAL HIGH (ref 5–40)

## 2019-11-18 LAB — RENAL FUNCTION PANEL
Albumin: 4.8 g/dL (ref 4.0–5.0)
BUN/Creatinine Ratio: 13 (ref 9–20)
BUN: 17 mg/dL (ref 6–24)
CO2: 23 mmol/L (ref 20–29)
Calcium: 9.7 mg/dL (ref 8.7–10.2)
Chloride: 101 mmol/L (ref 96–106)
Creatinine, Ser: 1.34 mg/dL — ABNORMAL HIGH (ref 0.76–1.27)
GFR calc Af Amer: 71 mL/min/{1.73_m2} (ref >59)
GFR calc non Af Amer: 62 mL/min/{1.73_m2} (ref >59)
Glucose: 105 mg/dL — ABNORMAL HIGH (ref 65–99)
Phosphorus: 3.1 mg/dL (ref 2.8–4.1)
Potassium: 4.3 mmol/L (ref 3.5–5.2)
Sodium: 139 mmol/L (ref 134–144)

## 2019-11-18 NOTE — Patient Instructions (Signed)

## 2019-12-02 ENCOUNTER — Other Ambulatory Visit: Payer: Self-pay | Admitting: Family Medicine

## 2019-12-02 DIAGNOSIS — I10 Essential (primary) hypertension: Secondary | ICD-10-CM

## 2020-02-16 ENCOUNTER — Ambulatory Visit (INDEPENDENT_AMBULATORY_CARE_PROVIDER_SITE_OTHER): Payer: Self-pay | Admitting: Family Medicine

## 2020-02-16 ENCOUNTER — Encounter: Payer: Self-pay | Admitting: Family Medicine

## 2020-02-16 ENCOUNTER — Other Ambulatory Visit: Payer: Self-pay

## 2020-02-16 VITALS — BP 140/100 | HR 72 | Ht 69.0 in | Wt 228.0 lb

## 2020-02-16 DIAGNOSIS — R21 Rash and other nonspecific skin eruption: Secondary | ICD-10-CM

## 2020-02-16 DIAGNOSIS — Z6833 Body mass index (BMI) 33.0-33.9, adult: Secondary | ICD-10-CM

## 2020-02-16 DIAGNOSIS — E782 Mixed hyperlipidemia: Secondary | ICD-10-CM

## 2020-02-16 DIAGNOSIS — T887XXA Unspecified adverse effect of drug or medicament, initial encounter: Secondary | ICD-10-CM

## 2020-02-16 DIAGNOSIS — E6609 Other obesity due to excess calories: Secondary | ICD-10-CM

## 2020-02-16 DIAGNOSIS — I10 Essential (primary) hypertension: Secondary | ICD-10-CM

## 2020-02-16 MED ORDER — LOSARTAN POTASSIUM-HCTZ 50-12.5 MG PO TABS: 1.0000 | ORAL_TABLET | Freq: Every day | ORAL | 0 refills | Status: DC

## 2020-02-16 NOTE — Progress Notes (Signed)
Date:  02/16/2020   Name:  Isaac [name]   DOB:  [birth year]   MRN:  [id number]   Chief Complaint: Hypertension (Is having ankle swelling since increase in med- was good on the 5mg . Also itching since increase in med)  Hypertension This is a chronic problem. The current episode started more than 1 year ago. The problem is uncontrolled. Pertinent negatives include no anxiety, blurred vision, chest pain, headaches, malaise/fatigue, neck pain, orthopnea, palpitations, peripheral edema, PND, shortness of breath or sweats. There are no associated agents to hypertension. Past treatments include calcium channel blockers. There are no compliance problems.  There is no history of angina, kidney disease, CVA, heart failure, PVD or retinopathy.  Hyperlipidemia Pertinent negatives include no chest pain, myalgias or shortness of breath.    Lab Results  Component Value Date   CREATININE 1.34 (H) 11/17/2019   BUN 17 11/17/2019   NA 139 11/17/2019   K 4.3 11/17/2019   CL 101 11/17/2019   CO2 23 11/17/2019   Lab Results  Component Value Date   CHOL 190 11/17/2019   HDL 31 (L) 11/17/2019   LDLCALC 115 (H) 11/17/2019   TRIG 251 (H) 11/17/2019   No results found for: TSH No results found for: HGBA1C No results found for: WBC, HGB, HCT, MCV, PLT No results found for: ALT, AST, GGT, ALKPHOS, BILITOT   Review of Systems  Constitutional: Negative for chills, fever and malaise/fatigue.  HENT: Negative for drooling, ear discharge, ear pain and sore throat.   Eyes: Negative for blurred vision.  Respiratory: Negative for cough, shortness of breath and wheezing.   Cardiovascular: Negative for chest pain, palpitations, orthopnea, leg swelling and PND.  Gastrointestinal: Negative for abdominal pain, blood in stool, constipation, diarrhea and nausea.  Endocrine: Negative for polydipsia.  Genitourinary: Negative for dysuria, frequency, hematuria and urgency.  Musculoskeletal: Negative for back  pain, myalgias and neck pain.  Skin: Negative for rash.  Allergic/Immunologic: Negative for environmental allergies.  Neurological: Negative for dizziness and headaches.  Hematological: Does not bruise/bleed easily.  Psychiatric/Behavioral: Negative for suicidal ideas. The patient is not nervous/anxious.     There are no problems to display for this patient.   No Known Allergies  Past Surgical History:  Procedure Laterality Date  . THROAT SURGERY      Social History   Tobacco Use  . Smoking status: Never Smoker  . Smokeless tobacco: Never Used  Vaping Use  . Vaping Use: Never used  Substance Use Topics  . Alcohol use: Not Currently  . Drug use: Never     Medication list has been reviewed and updated.  Current Meds  Medication Sig  . amLODipine (NORVASC) 10 MG tablet Take 1 tablet (10 mg total) by mouth daily.    PHQ 2/9 Scores 02/16/2020 11/17/2019 05/17/2019 09/09/2018  PHQ - 2 Score 0 0 0 0  PHQ- 9 Score 0 0 0 2    GAD 7 : Generalized Anxiety Score 02/16/2020 11/17/2019 05/17/2019  Nervous, Anxious, on Edge 0 0 0  Control/stop worrying 0 0 0  Worry too much - different things 0 0 0  Trouble relaxing 0 0 0  Restless 0 0 0  Easily annoyed or irritable 0 0 0  Afraid - awful might happen 0 0 0  Total GAD 7 Score 0 0 0    BP Readings from Last 3 Encounters:  11/17/19 (!) 130/98  05/17/19 140/90  10/21/18 136/82    Physical Exam Vitals and nursing  note reviewed.  HENT:     Head: Normocephalic.     Right Ear: Tympanic membrane, ear canal and external ear normal.     Left Ear: Tympanic membrane, ear canal and external ear normal.     Nose: Nose normal. No congestion or rhinorrhea.     Mouth/Throat:     Mouth: Oropharynx is clear and moist. Mucous membranes are moist.  Eyes:     General: No scleral icterus.       Right eye: No discharge.        Left eye: No discharge.     Extraocular Movements: EOM normal.     Conjunctiva/sclera: Conjunctivae normal.      Pupils: Pupils are equal, round, and reactive to light.  Neck:     Thyroid: No thyromegaly.     Vascular: No JVD.     Trachea: No tracheal deviation.  Cardiovascular:     Rate and Rhythm: Normal rate and regular rhythm.     Pulses: Intact distal pulses.     Heart sounds: Normal heart sounds. No murmur heard. No friction rub. No gallop.   Pulmonary:     Effort: No respiratory distress.     Breath sounds: Normal breath sounds. No wheezing, rhonchi or rales.  Abdominal:     General: Bowel sounds are normal.     Palpations: Abdomen is soft. There is no hepatosplenomegaly or mass.     Tenderness: There is no abdominal tenderness. There is no CVA tenderness, guarding or rebound.  Musculoskeletal:        General: No tenderness or edema. Normal range of motion.     Cervical back: Normal range of motion and neck supple.  Lymphadenopathy:     Cervical: No cervical adenopathy.  Skin:    General: Skin is warm.     Findings: No rash.  Neurological:     Mental Status: He is alert and oriented to person, place, and time.     Cranial Nerves: No cranial nerve deficit.     Deep Tendon Reflexes: Strength normal and reflexes are normal and symmetric.     Wt Readings from Last 3 Encounters:  02/16/20 228 lb (103.4 kg)  11/17/19 230 lb (104.3 kg)  05/17/19 236 lb (107 kg)    Pulse 72   Ht 5\' 9"  (1.753 m)   Wt 228 lb (103.4 kg)   BMI 33.67 kg/m   Assessment and Plan:  1. Essential hypertension Chronic.  Uncontrolled.  Stable.  Blood pressure 140/100.  This is despite elevating amlodipine to 10 mg daily.  Patient has not been able to tolerate this because of ankle swelling and there is some question of a rash as noted below.  We will discontinue the amlodipine and we will initiate losartan hydrochlorothiazide 50-12.5 mg daily and will recheck blood pressure in 6 weeks. - losartan-hydrochlorothiazide (HYZAAR) 50-12.5 MG tablet; Take 1 tablet by mouth daily.  Dispense: 60 tablet; Refill:  0  2. Mixed hyperlipidemia Noted that patient has elevated LDL and but has doing better with his diet.  We will recheck LDL only and perhaps will be adding atorvastatin. - Direct LDL  3. Class 1 obesity due to excess calories with serious comorbidity and body mass index (BMI) of 33.0 to 33.9 in adult Health risks of being over weight were discussed and patient was counseled on weight loss options and exercise.  Patient has been given Dash diet for weight loss and blood pressure control.  4. Rash New onset.  Patient  has had a pruritic macular erythematous rash since elevating amlodipine to 10 mg.  This may be a side effect to the medication and therefore this is one of the reasons why will be discontinuing.  5. Medication side effect As noted above patient had ankle swelling with increased dosing of the amlodipine and there is some question of a rash.  We will discontinue current medication to see if there is resolution of symptoms.

## 2020-02-16 NOTE — Patient Instructions (Signed)
DASH Eating Plan DASH stands for "Dietary Approaches to Stop Hypertension." The DASH eating plan is a healthy eating plan that has been shown to reduce high blood pressure (hypertension). It may also reduce your risk for type 2 diabetes, heart disease, and stroke. The DASH eating plan may also help with weight loss. What are tips for following this plan?  General guidelines  Avoid eating more than 2,300 mg (milligrams) of salt (sodium) a day. If you have hypertension, you may need to reduce your sodium intake to 1,500 mg a day.  Limit alcohol intake to no more than 1 drink a day for nonpregnant women and 2 drinks a day for men. One drink equals 12 oz of beer, 5 oz of wine, or 1 oz of hard liquor.  Work with your health care provider to maintain a healthy body weight or to lose weight. Ask what an ideal weight is for you.  Get at least 30 minutes of exercise that causes your heart to beat faster (aerobic exercise) most days of the week. Activities may include walking, swimming, or biking.  Work with your health care provider or diet and nutrition specialist (dietitian) to adjust your eating plan to your individual calorie needs. Reading food labels   Check food labels for the amount of sodium per serving. Choose foods with less than 5 percent of the Daily Value of sodium. Generally, foods with less than 300 mg of sodium per serving fit into this eating plan.  To find whole grains, look for the word "whole" as the first word in the ingredient list. Shopping  Buy products labeled as "low-sodium" or "no salt added."  Buy fresh foods. Avoid canned foods and premade or frozen meals. Cooking  Avoid adding salt when cooking. Use salt-free seasonings or herbs instead of table salt or sea salt. Check with your health care provider or pharmacist before using salt substitutes.  Do not fry foods. Cook foods using healthy methods such as baking, boiling, grilling, and broiling instead.  Cook with  heart-healthy oils, such as olive, canola, soybean, or sunflower oil. Meal planning  Eat a balanced diet that includes: ? 5 or more servings of fruits and vegetables each day. At each meal, try to fill half of your plate with fruits and vegetables. ? Up to 6-8 servings of whole grains each day. ? Less than 6 oz of lean meat, poultry, or fish each day. A 3-oz serving of meat is about the same size as a deck of cards. One egg equals 1 oz. ? 2 servings of low-fat dairy each day. ? A serving of nuts, seeds, or beans 5 times each week. ? Heart-healthy fats. Healthy fats called Omega-3 fatty acids are found in foods such as flaxseeds and coldwater fish, like sardines, salmon, and mackerel.  Limit how much you eat of the following: ? Canned or prepackaged foods. ? Food that is high in trans fat, such as fried foods. ? Food that is high in saturated fat, such as fatty meat. ? Sweets, desserts, sugary drinks, and other foods with added sugar. ? Full-fat dairy products.  Do not salt foods before eating.  Try to eat at least 2 vegetarian meals each week.  Eat more home-cooked food and less restaurant, buffet, and fast food.  When eating at a restaurant, ask that your food be prepared with less salt or no salt, if possible. What foods are recommended? The items listed may not be a complete list. Talk with your dietitian about   what dietary choices are best for you. Grains Whole-grain or whole-wheat bread. Whole-grain or whole-wheat pasta. Brown rice. Oatmeal. Quinoa. Bulgur. Whole-grain and low-sodium cereals. Pita bread. Low-fat, low-sodium crackers. Whole-wheat flour tortillas. Vegetables Fresh or frozen vegetables (raw, steamed, roasted, or grilled). Low-sodium or reduced-sodium tomato and vegetable juice. Low-sodium or reduced-sodium tomato sauce and tomato paste. Low-sodium or reduced-sodium canned vegetables. Fruits All fresh, dried, or frozen fruit. Canned fruit in natural juice (without  added sugar). Meat and other protein foods Skinless chicken or turkey. Ground chicken or turkey. Pork with fat trimmed off. Fish and seafood. Egg whites. Dried beans, peas, or lentils. Unsalted nuts, nut butters, and seeds. Unsalted canned beans. Lean cuts of beef with fat trimmed off. Low-sodium, lean deli meat. Dairy Low-fat (1%) or fat-free (skim) milk. Fat-free, low-fat, or reduced-fat cheeses. Nonfat, low-sodium ricotta or cottage cheese. Low-fat or nonfat yogurt. Low-fat, low-sodium cheese. Fats and oils Soft margarine without trans fats. Vegetable oil. Low-fat, reduced-fat, or light mayonnaise and salad dressings (reduced-sodium). Canola, safflower, olive, soybean, and sunflower oils. Avocado. Seasoning and other foods Herbs. Spices. Seasoning mixes without salt. Unsalted popcorn and pretzels. Fat-free sweets. What foods are not recommended? The items listed may not be a complete list. Talk with your dietitian about what dietary choices are best for you. Grains Baked goods made with fat, such as croissants, muffins, or some breads. Dry pasta or rice meal packs. Vegetables Creamed or fried vegetables. Vegetables in a cheese sauce. Regular canned vegetables (not low-sodium or reduced-sodium). Regular canned tomato sauce and paste (not low-sodium or reduced-sodium). Regular tomato and vegetable juice (not low-sodium or reduced-sodium). Pickles. Olives. Fruits Canned fruit in a light or heavy syrup. Fried fruit. Fruit in cream or butter sauce. Meat and other protein foods Fatty cuts of meat. Ribs. Fried meat. Bacon. Sausage. Bologna and other processed lunch meats. Salami. Fatback. Hotdogs. Bratwurst. Salted nuts and seeds. Canned beans with added salt. Canned or smoked fish. Whole eggs or egg yolks. Chicken or turkey with skin. Dairy Whole or 2% milk, cream, and half-and-half. Whole or full-fat cream cheese. Whole-fat or sweetened yogurt. Full-fat cheese. Nondairy creamers. Whipped toppings.  Processed cheese and cheese spreads. Fats and oils Butter. Stick margarine. Lard. Shortening. Ghee. Bacon fat. Tropical oils, such as coconut, palm kernel, or palm oil. Seasoning and other foods Salted popcorn and pretzels. Onion salt, garlic salt, seasoned salt, table salt, and sea salt. Worcestershire sauce. Tartar sauce. Barbecue sauce. Teriyaki sauce. Soy sauce, including reduced-sodium. Steak sauce. Canned and packaged gravies. Fish sauce. Oyster sauce. Cocktail sauce. Horseradish that you find on the shelf. Ketchup. Mustard. Meat flavorings and tenderizers. Bouillon cubes. Hot sauce and Tabasco sauce. Premade or packaged marinades. Premade or packaged taco seasonings. Relishes. Regular salad dressings. Where to find more information:  National Heart, Lung, and Blood Institute: www.nhlbi.nih.gov  American Heart Association: www.heart.org Summary  The DASH eating plan is a healthy eating plan that has been shown to reduce high blood pressure (hypertension). It may also reduce your risk for type 2 diabetes, heart disease, and stroke.  With the DASH eating plan, you should limit salt (sodium) intake to 2,300 mg a day. If you have hypertension, you may need to reduce your sodium intake to 1,500 mg a day.  When on the DASH eating plan, aim to eat more fresh fruits and vegetables, whole grains, lean proteins, low-fat dairy, and heart-healthy fats.  Work with your health care provider or diet and nutrition specialist (dietitian) to adjust your eating plan to your   individual calorie needs. This information is not intended to replace advice given to you by your health care provider. Make sure you discuss any questions you have with your health care provider. Document Revised: 01/30/2017 Document Reviewed: 02/11/2016 Elsevier Patient Education  2020 [employer]  

## 2020-02-17 LAB — LDL CHOLESTEROL, DIRECT: LDL Direct: 119 mg/dL — ABNORMAL HIGH (ref 0–99)

## 2020-03-29 ENCOUNTER — Other Ambulatory Visit: Payer: Self-pay

## 2020-03-29 ENCOUNTER — Encounter: Payer: Self-pay | Admitting: Family Medicine

## 2020-03-29 ENCOUNTER — Ambulatory Visit (INDEPENDENT_AMBULATORY_CARE_PROVIDER_SITE_OTHER): Payer: Self-pay | Admitting: Family Medicine

## 2020-03-29 VITALS — BP 124/88 | HR 68 | Ht 69.0 in | Wt 230.0 lb

## 2020-03-29 DIAGNOSIS — I10 Essential (primary) hypertension: Secondary | ICD-10-CM

## 2020-03-29 DIAGNOSIS — R059 Cough, unspecified: Secondary | ICD-10-CM

## 2020-03-29 MED ORDER — DM-GUAIFENESIN ER 30-600 MG PO TB12: 1.0000 | ORAL_TABLET | Freq: Two times a day (BID) | ORAL | 2 refills | Status: DC

## 2020-03-29 MED ORDER — LOSARTAN POTASSIUM-HCTZ 50-12.5 MG PO TABS: 1.0000 | ORAL_TABLET | Freq: Every day | ORAL | 1 refills | Status: DC

## 2020-03-29 NOTE — Progress Notes (Signed)
Date:  03/29/2020   Name:  Isaac [name]   DOB:  10/17/1970   MRN:  [id number]   Chief Complaint: Hypertension (Recheck on b/p)  Hypertension This is a chronic problem. The current episode started more than 1 year ago. The problem has been gradually improving since onset. The problem is controlled. Pertinent negatives include no anxiety, blurred vision, chest pain, headaches, malaise/fatigue, neck pain, orthopnea, palpitations, peripheral edema, PND, shortness of breath or sweats. There are no associated agents to hypertension. There are no known risk factors for coronary artery disease. Past treatments include angiotensin blockers and diuretics. The current treatment provides moderate improvement. There are no compliance problems.  There is no history of angina, kidney disease, CAD/MI, CVA, heart failure, left ventricular hypertrophy, PVD or retinopathy. There is no history of chronic renal disease, a hypertension causing med or renovascular disease.    Lab Results  Component Value Date   CREATININE 1.34 (H) 11/17/2019   BUN 17 11/17/2019   NA 139 11/17/2019   K 4.3 11/17/2019   CL 101 11/17/2019   CO2 23 11/17/2019   Lab Results  Component Value Date   CHOL 190 11/17/2019   HDL 31 (L) 11/17/2019   LDLCALC 115 (H) 11/17/2019   LDLDIRECT 119 (H) 02/16/2020   TRIG 251 (H) 11/17/2019   No results found for: TSH No results found for: HGBA1C No results found for: WBC, HGB, HCT, MCV, PLT No results found for: ALT, AST, GGT, ALKPHOS, BILITOT   Review of Systems  Constitutional: Negative for chills, fever and malaise/fatigue.  HENT: Negative for drooling, ear discharge, ear pain and sore throat.   Eyes: Negative for blurred vision.  Respiratory: Negative for cough, shortness of breath and wheezing.   Cardiovascular: Negative for chest pain, palpitations, orthopnea, leg swelling and PND.  Gastrointestinal: Negative for abdominal pain, blood in stool, constipation, diarrhea  and nausea.  Endocrine: Negative for polydipsia.  Genitourinary: Negative for dysuria, frequency, hematuria and urgency.  Musculoskeletal: Negative for back pain, myalgias and neck pain.  Skin: Negative for rash.  Allergic/Immunologic: Negative for environmental allergies.  Neurological: Negative for dizziness and headaches.  Hematological: Does not bruise/bleed easily.  Psychiatric/Behavioral: Negative for suicidal ideas. The patient is not nervous/anxious.     There are no problems to display for this patient.   No Known Allergies  Past Surgical History:  Procedure Laterality Date  . THROAT SURGERY      Social History   Tobacco Use  . Smoking status: Never Smoker  . Smokeless tobacco: Never Used  Vaping Use  . Vaping Use: Never used  Substance Use Topics  . Alcohol use: Not Currently  . Drug use: Never     Medication list has been reviewed and updated.  Current Meds  Medication Sig  . losartan-hydrochlorothiazide (HYZAAR) 50-12.5 MG tablet Take 1 tablet by mouth daily.    PHQ 2/9 Scores 02/16/2020 11/17/2019 05/17/2019 09/09/2018  PHQ - 2 Score 0 0 0 0  PHQ- 9 Score 0 0 0 2    GAD 7 : Generalized Anxiety Score 02/16/2020 11/17/2019 05/17/2019  Nervous, Anxious, on Edge 0 0 0  Control/stop worrying 0 0 0  Worry too much - different things 0 0 0  Trouble relaxing 0 0 0  Restless 0 0 0  Easily annoyed or irritable 0 0 0  Afraid - awful might happen 0 0 0  Total GAD 7 Score 0 0 0    BP Readings from Last 3 Encounters:  03/29/20 124/88  02/16/20 (!) 140/100  11/17/19 (!) 130/98    Physical Exam Vitals and nursing note reviewed.  HENT:     Head: Normocephalic.     Right Ear: Tympanic membrane, ear canal and external ear normal. There is no impacted cerumen.     Left Ear: Tympanic membrane, ear canal and external ear normal. There is no impacted cerumen.     Nose: Nose normal. No congestion or rhinorrhea.     Mouth/Throat:     Mouth: Oropharynx is clear and  moist.  Eyes:     General: No scleral icterus.       Right eye: No discharge.        Left eye: No discharge.     Extraocular Movements: EOM normal.     Conjunctiva/sclera: Conjunctivae normal.     Pupils: Pupils are equal, round, and reactive to light.  Neck:     Thyroid: No thyromegaly.     Vascular: No JVD.     Trachea: No tracheal deviation.  Cardiovascular:     Rate and Rhythm: Normal rate and regular rhythm.     Pulses: Intact distal pulses.     Heart sounds: Normal heart sounds. No murmur heard. No friction rub. No gallop.   Pulmonary:     Effort: No respiratory distress.     Breath sounds: Normal breath sounds. No wheezing, rhonchi or rales.  Abdominal:     General: Bowel sounds are normal.     Palpations: Abdomen is soft. There is no hepatosplenomegaly or mass.     Tenderness: There is no abdominal tenderness. There is no CVA tenderness, guarding or rebound.  Musculoskeletal:        General: No tenderness or edema. Normal range of motion.     Cervical back: Normal range of motion and neck supple.  Lymphadenopathy:     Cervical: No cervical adenopathy.  Skin:    General: Skin is warm.     Capillary Refill: Capillary refill takes less than 2 seconds.     Findings: No rash.  Neurological:     Mental Status: He is alert and oriented to person, place, and time.     Cranial Nerves: No cranial nerve deficit.     Deep Tendon Reflexes: Strength normal and reflexes are normal and symmetric.     Wt Readings from Last 3 Encounters:  03/29/20 230 lb (104.3 kg)  02/16/20 228 lb (103.4 kg)  11/17/19 230 lb (104.3 kg)    BP 124/88   Pulse 68   Ht 5\' 9"  (1.753 m)   Wt 230 lb (104.3 kg)   BMI 33.97 kg/m   Assessment and Plan: 1. Essential hypertension Chronic.  Controlled.  Stable.  Blood pressure today is 124/88.  Patient is tolerating new preparation which is losartan hydrochlorothiazide 50-12 0.5 once a day.  Will check renal function panel for electrolytes and GFR  concerns. - losartan-hydrochlorothiazide (HYZAAR) 50-12.5 MG tablet; Take 1 tablet by mouth daily.  Dispense: 90 tablet; Refill: 1 - Renal Function Panel  2. Cough Patient with nonproductive cough and works mostly outdoors.  Would like access to guaifenesin and dextromethorphan on an as-needed basis. - dextromethorphan-guaiFENesin (MUCINEX DM) 30-600 MG 12hr tablet; Take 1 tablet by mouth 2 (two) times daily.  Dispense: 20 tablet; Refill: 2

## 2020-03-30 LAB — RENAL FUNCTION PANEL
Albumin: 4.6 g/dL (ref 4.0–5.0)
BUN/Creatinine Ratio: 15 (ref 9–20)
BUN: 19 mg/dL (ref 6–24)
CO2: 23 mmol/L (ref 20–29)
Calcium: 9.4 mg/dL (ref 8.7–10.2)
Chloride: 102 mmol/L (ref 96–106)
Creatinine, Ser: 1.27 mg/dL (ref 0.76–1.27)
GFR calc Af Amer: 76 mL/min/{1.73_m2} (ref >59)
GFR calc non Af Amer: 65 mL/min/{1.73_m2} (ref >59)
Glucose: 108 mg/dL — ABNORMAL HIGH (ref 65–99)
Phosphorus: 3.2 mg/dL (ref 2.8–4.1)
Potassium: 4.4 mmol/L (ref 3.5–5.2)
Sodium: 140 mmol/L (ref 134–144)

## 2020-04-13 ENCOUNTER — Other Ambulatory Visit: Payer: Self-pay | Admitting: Family Medicine

## 2020-04-13 DIAGNOSIS — I10 Essential (primary) hypertension: Secondary | ICD-10-CM

## 2020-05-03 ENCOUNTER — Ambulatory Visit: Payer: Self-pay | Admitting: Family Medicine

## 2020-09-13 ENCOUNTER — Other Ambulatory Visit: Payer: Self-pay

## 2020-09-13 ENCOUNTER — Telehealth: Payer: Self-pay

## 2020-09-13 ENCOUNTER — Ambulatory Visit (INDEPENDENT_AMBULATORY_CARE_PROVIDER_SITE_OTHER): Payer: Self-pay | Admitting: Family Medicine

## 2020-09-13 ENCOUNTER — Encounter: Payer: Self-pay | Admitting: Family Medicine

## 2020-09-13 VITALS — BP 120/80 | HR 76 | Ht 69.0 in | Wt 229.0 lb

## 2020-09-13 DIAGNOSIS — I10 Essential (primary) hypertension: Secondary | ICD-10-CM

## 2020-09-13 DIAGNOSIS — E782 Mixed hyperlipidemia: Secondary | ICD-10-CM

## 2020-09-13 DIAGNOSIS — Z6833 Body mass index (BMI) 33.0-33.9, adult: Secondary | ICD-10-CM

## 2020-09-13 DIAGNOSIS — E6609 Other obesity due to excess calories: Secondary | ICD-10-CM

## 2020-09-13 MED ORDER — LOSARTAN POTASSIUM-HCTZ 50-12.5 MG PO TABS: 1.0000 | ORAL_TABLET | Freq: Every day | ORAL | 1 refills | Status: DC

## 2020-09-13 NOTE — Progress Notes (Signed)
Date:  09/13/2020   Name:  Isaac [name]   DOB:  02-04-1971   MRN:  [id number]   Chief Complaint: Hypertension  Hypertension This is a chronic problem. The current episode started more than 1 year ago. The problem has been gradually improving since onset. The problem is controlled. Pertinent negatives include no anxiety, blurred vision, chest pain, headaches, malaise/fatigue, neck pain, orthopnea, palpitations, peripheral edema, PND, shortness of breath or sweats. There are no associated agents to hypertension. Risk factors for coronary artery disease include obesity. Past treatments include ACE inhibitors and diuretics.   Lab Results  Component Value Date   CREATININE 1.27 03/29/2020   BUN 19 03/29/2020   NA 140 03/29/2020   K 4.4 03/29/2020   CL 102 03/29/2020   CO2 23 03/29/2020   Lab Results  Component Value Date   CHOL 190 11/17/2019   HDL 31 (L) 11/17/2019   LDLCALC 115 (H) 11/17/2019   LDLDIRECT 119 (H) 02/16/2020   TRIG 251 (H) 11/17/2019   No results found for: TSH No results found for: HGBA1C No results found for: WBC, HGB, HCT, MCV, PLT No results found for: ALT, AST, GGT, ALKPHOS, BILITOT   Review of Systems  Constitutional:  Negative for chills, fever and malaise/fatigue.  HENT:  Negative for drooling, ear discharge, ear pain and sore throat.   Eyes:  Negative for blurred vision.  Respiratory:  Negative for cough, shortness of breath and wheezing.   Cardiovascular:  Negative for chest pain, palpitations, orthopnea, leg swelling and PND.  Gastrointestinal:  Negative for abdominal pain, blood in stool, constipation, diarrhea and nausea.  Endocrine: Negative for polydipsia.  Genitourinary:  Negative for dysuria, frequency, hematuria and urgency.  Musculoskeletal:  Negative for back pain, myalgias and neck pain.  Skin:  Negative for rash.  Allergic/Immunologic: Negative for environmental allergies.  Neurological:  Negative for dizziness and headaches.   Hematological:  Does not bruise/bleed easily.  Psychiatric/Behavioral:  Negative for suicidal ideas. The patient is not nervous/anxious.    There are no problems to display for this patient.   No Known Allergies  Past Surgical History:  Procedure Laterality Date   THROAT SURGERY      Social History   Tobacco Use   Smoking status: Never   Smokeless tobacco: Never  Vaping Use   Vaping Use: Never used  Substance Use Topics   Alcohol use: Not Currently   Drug use: Never     Medication list has been reviewed and updated.  Current Meds  Medication Sig   losartan-hydrochlorothiazide (HYZAAR) 50-12.5 MG tablet Take 1 tablet by mouth daily.    PHQ 2/9 Scores 09/13/2020 02/16/2020 11/17/2019 05/17/2019  PHQ - 2 Score 0 0 0 0  PHQ- 9 Score 0 0 0 0    GAD 7 : Generalized Anxiety Score 09/13/2020 02/16/2020 11/17/2019 05/17/2019  Nervous, Anxious, on Edge 0 0 0 0  Control/stop worrying 0 0 0 0  Worry too much - different things 0 0 0 0  Trouble relaxing 0 0 0 0  Restless 0 0 0 0  Easily annoyed or irritable 0 0 0 0  Afraid - awful might happen 0 0 0 0  Total GAD 7 Score 0 0 0 0    BP Readings from Last 3 Encounters:  09/13/20 120/80  03/29/20 124/88  02/16/20 (!) 140/100    Physical Exam Vitals and nursing note reviewed.  HENT:     Head: Normocephalic.     Right Ear:  Tympanic membrane, ear canal and external ear normal. There is no impacted cerumen.     Left Ear: Tympanic membrane, ear canal and external ear normal. There is no impacted cerumen.     Nose: Nose normal. No congestion or rhinorrhea.  Eyes:     General: No scleral icterus.       Right eye: No discharge.        Left eye: No discharge.     Conjunctiva/sclera: Conjunctivae normal.     Pupils: Pupils are equal, round, and reactive to light.  Neck:     Thyroid: No thyromegaly.     Vascular: No carotid bruit or JVD.     Trachea: No tracheal deviation.  Cardiovascular:     Rate and Rhythm: Normal rate and  regular rhythm.     Heart sounds: Normal heart sounds, S1 normal and S2 normal. No murmur heard. No systolic murmur is present.  No diastolic murmur is present.    No friction rub. No gallop. No S3 or S4 sounds.  Pulmonary:     Effort: No respiratory distress.     Breath sounds: Normal breath sounds. No wheezing, rhonchi or rales.  Abdominal:     General: Bowel sounds are normal.     Palpations: Abdomen is soft. There is no mass.     Tenderness: There is no abdominal tenderness. There is no guarding or rebound.  Musculoskeletal:        General: No tenderness. Normal range of motion.     Cervical back: Normal range of motion and neck supple.     Right lower leg: No edema.     Left lower leg: No edema.  Lymphadenopathy:     Cervical: No cervical adenopathy.  Skin:    General: Skin is warm.     Findings: No rash.  Neurological:     Mental Status: He is alert and oriented to person, place, and time.     Cranial Nerves: No cranial nerve deficit.     Deep Tendon Reflexes: Reflexes are normal and symmetric.    Wt Readings from Last 3 Encounters:  09/13/20 229 lb (103.9 kg)  03/29/20 230 lb (104.3 kg)  02/16/20 228 lb (103.4 kg)    BP 120/80   Pulse 76   Ht 5\' 9"  (1.753 m)   Wt 229 lb (103.9 kg)   BMI 33.82 kg/m   Assessment and Plan:  1. Essential hypertension Chronic.  Controlled.  Stable.  Blood pressure 120/80.  Continue losartan hydrochlorothiazide 50-12.5 mg once a day.  Because of prescription increase cost patient has been given a prescription for valsartan hydrochlorothiazide 80-12.5 mg to see if this may be a cheaper alternative.  We will recheck patient in 6 months. - losartan-hydrochlorothiazide (HYZAAR) 50-12.5 MG tablet; Take 1 tablet by mouth daily.  Dispense: 90 tablet; Refill: 1  2.  Mixed hyperlipidemia patient has been given low-cholesterol diet for weight loss and cholesterol control                                            We have discussed colonoscopy  and since patient does not have insurance at this time it is not a likely endeavor.  We have given him Hemoccult cards to check for blood and also suggested the possibility of fit test.

## 2020-09-13 NOTE — Telephone Encounter (Signed)
error 

## 2020-09-13 NOTE — Patient Instructions (Signed)

## 2020-10-11 ENCOUNTER — Other Ambulatory Visit: Payer: Self-pay | Admitting: Family Medicine

## 2020-10-11 DIAGNOSIS — I10 Essential (primary) hypertension: Secondary | ICD-10-CM

## 2020-11-16 ENCOUNTER — Other Ambulatory Visit: Payer: Self-pay

## 2020-11-16 DIAGNOSIS — Z1211 Encounter for screening for malignant neoplasm of colon: Secondary | ICD-10-CM

## 2020-11-16 NOTE — Progress Notes (Signed)
FIT test ordered

## 2020-11-27 ENCOUNTER — Telehealth: Payer: Self-pay

## 2020-11-27 NOTE — Telephone Encounter (Signed)
Called pt again and no way to leave a message- needs to turn in FIT test

## 2021-02-06 NOTE — Telephone Encounter (Signed)
Pt's spouse called in returning the office call.   Advised per message below per Delice Bison. Spouse says that she will remind the pt to pick up.

## 2021-03-18 ENCOUNTER — Ambulatory Visit: Payer: Self-pay | Admitting: Family Medicine

## 2021-03-21 ENCOUNTER — Encounter: Payer: Self-pay | Admitting: Family Medicine

## 2021-03-21 ENCOUNTER — Other Ambulatory Visit: Payer: Self-pay

## 2021-03-21 ENCOUNTER — Ambulatory Visit (INDEPENDENT_AMBULATORY_CARE_PROVIDER_SITE_OTHER): Payer: Self-pay | Admitting: Family Medicine

## 2021-03-21 VITALS — BP 124/80 | HR 80 | Ht 69.0 in | Wt 228.0 lb

## 2021-03-21 DIAGNOSIS — Z1211 Encounter for screening for malignant neoplasm of colon: Secondary | ICD-10-CM

## 2021-03-21 DIAGNOSIS — N1831 Chronic kidney disease, stage 3a: Secondary | ICD-10-CM

## 2021-03-21 DIAGNOSIS — I10 Essential (primary) hypertension: Secondary | ICD-10-CM

## 2021-03-21 MED ORDER — LOSARTAN POTASSIUM-HCTZ 50-12.5 MG PO TABS: 1.0000 | ORAL_TABLET | Freq: Every day | ORAL | 1 refills | Status: DC

## 2021-03-21 NOTE — Progress Notes (Signed)
Date:  03/21/2021   Name:  Isaac [name]   DOB:  02-16-1971   MRN:  [id number]   Chief Complaint: Hypertension and Colon Cancer Screening  Hypertension This is a chronic problem. The current episode started more than 1 year ago. The problem has been gradually improving since onset. The problem is controlled. Pertinent negatives include no anxiety, blurred vision, chest pain, headaches, malaise/fatigue, neck pain, orthopnea, palpitations, peripheral edema, PND, shortness of breath or sweats. There are no associated agents to hypertension. Past treatments include angiotensin blockers and diuretics. The current treatment provides moderate improvement. There are no compliance problems.  There is no history of angina, kidney disease, CAD/MI, CVA, heart failure, left ventricular hypertrophy, PVD or retinopathy. There is no history of chronic renal disease, a hypertension causing med or renovascular disease.   Lab Results  Component Value Date   NA 140 03/29/2020   K 4.4 03/29/2020   CO2 23 03/29/2020   GLUCOSE 108 (H) 03/29/2020   BUN 19 03/29/2020   CREATININE 1.27 03/29/2020   CALCIUM 9.4 03/29/2020   GFRNONAA 65 03/29/2020   Lab Results  Component Value Date   CHOL 190 11/17/2019   HDL 31 (L) 11/17/2019   LDLCALC 115 (H) 11/17/2019   LDLDIRECT 119 (H) 02/16/2020   TRIG 251 (H) 11/17/2019   No results found for: TSH No results found for: HGBA1C No results found for: WBC, HGB, HCT, MCV, PLT No results found for: ALT, AST, GGT, ALKPHOS, BILITOT No results found for: 25OHVITD2, 25OHVITD3, VD25OH   Review of Systems  Constitutional:  Negative for chills, fever and malaise/fatigue.  HENT:  Negative for drooling, ear discharge, ear pain and sore throat.   Eyes:  Negative for blurred vision.  Respiratory:  Negative for cough, shortness of breath and wheezing.   Cardiovascular:  Negative for chest pain, palpitations, orthopnea, leg swelling and PND.  Gastrointestinal:  Negative  for abdominal pain, blood in stool, constipation, diarrhea and nausea.  Endocrine: Negative for polydipsia.  Genitourinary:  Negative for dysuria, frequency, hematuria and urgency.  Musculoskeletal:  Negative for back pain, myalgias and neck pain.  Skin:  Negative for rash.  Allergic/Immunologic: Negative for environmental allergies.  Neurological:  Negative for dizziness and headaches.  Hematological:  Does not bruise/bleed easily.  Psychiatric/Behavioral:  Negative for suicidal ideas. The patient is not nervous/anxious.    There are no problems to display for this patient.   No Known Allergies  Past Surgical History:  Procedure Laterality Date   THROAT SURGERY      Social History   Tobacco Use   Smoking status: Never   Smokeless tobacco: Never  Vaping Use   Vaping Use: Never used  Substance Use Topics   Alcohol use: Not Currently   Drug use: Never     Medication list has been reviewed and updated.  Current Meds  Medication Sig   [DISCONTINUED] losartan-hydrochlorothiazide (HYZAAR) 50-12.5 MG tablet Take 1 tablet by mouth daily.    PHQ 2/9 Scores 03/21/2021 09/13/2020 02/16/2020 11/17/2019  PHQ - 2 Score 0 0 0 0  PHQ- 9 Score 0 0 0 0    GAD 7 : Generalized Anxiety Score 03/21/2021 09/13/2020 02/16/2020 11/17/2019  Nervous, Anxious, on Edge 0 0 0 0  Control/stop worrying 0 0 0 0  Worry too much - different things 0 0 0 0  Trouble relaxing 0 0 0 0  Restless 0 0 0 0  Easily annoyed or irritable 0 0 0 0  Afraid -  awful might happen 0 0 0 0  Total GAD 7 Score 0 0 0 0  Anxiety Difficulty Not difficult at all - - -    BP Readings from Last 3 Encounters:  03/21/21 124/80  09/13/20 120/80  03/29/20 124/88    Physical Exam Vitals and nursing note reviewed.  HENT:     Head: Normocephalic.     Right Ear: Tympanic membrane and external ear normal.     Left Ear: Tympanic membrane and external ear normal.     Nose: Nose normal.  Eyes:     General: No scleral  icterus.       Right eye: No discharge.        Left eye: No discharge.     Conjunctiva/sclera: Conjunctivae normal.     Pupils: Pupils are equal, round, and reactive to light.  Neck:     Thyroid: No thyromegaly.     Vascular: No JVD.     Trachea: No tracheal deviation.  Cardiovascular:     Rate and Rhythm: Normal rate and regular rhythm.     Heart sounds: Normal heart sounds. No murmur heard.   No friction rub. No gallop.  Pulmonary:     Effort: No respiratory distress.     Breath sounds: Normal breath sounds. No wheezing, rhonchi or rales.  Abdominal:     General: Bowel sounds are normal.     Palpations: Abdomen is soft. There is no mass.     Tenderness: There is no abdominal tenderness. There is no guarding or rebound.  Musculoskeletal:        General: No tenderness. Normal range of motion.     Cervical back: Normal range of motion and neck supple.  Lymphadenopathy:     Cervical: No cervical adenopathy.  Skin:    General: Skin is warm.     Findings: No rash.  Neurological:     Mental Status: He is alert and oriented to person, place, and time.     Cranial Nerves: No cranial nerve deficit.     Deep Tendon Reflexes: Reflexes are normal and symmetric.    Wt Readings from Last 3 Encounters:  03/21/21 228 lb (103.4 kg)  09/13/20 229 lb (103.9 kg)  03/29/20 230 lb (104.3 kg)    BP 124/80    Pulse 80    Ht 5\' 9"  (1.753 m)    Wt 228 lb (103.4 kg)    BMI 33.67 kg/m   Assessment and Plan:  1. Essential hypertension Chronic.  Controlled.  Stable.  Blood pressure today is 124/80.  Continue losartan hydrochlorothiazide 50-12.5 mg once a day.  Will check renal function panel for current electrolytes and GFR. - losartan-hydrochlorothiazide (HYZAAR) 50-12.5 MG tablet; Take 1 tablet by mouth daily.  Dispense: 90 tablet; Refill: 1 - Lipid Panel With LDL/HDL Ratio - Renal Function Panel  2. Stage 3a chronic kidney disease (HCC) Chronic.  Episodic.  Was elevated on previous  occasions but most recently had a GFR  3. Colon cancer screening Discussed and patient agrees to FIT surveillance for colon cancer. - Fecal occult blood, imunochemical

## 2021-03-22 LAB — RENAL FUNCTION PANEL
Albumin: 4.9 g/dL (ref 3.8–4.9)
BUN/Creatinine Ratio: 14 (ref 9–20)
BUN: 21 mg/dL (ref 6–24)
CO2: 21 mmol/L (ref 20–29)
Calcium: 9.6 mg/dL (ref 8.7–10.2)
Chloride: 101 mmol/L (ref 96–106)
Creatinine, Ser: 1.47 mg/dL — ABNORMAL HIGH (ref 0.76–1.27)
Glucose: 104 mg/dL — ABNORMAL HIGH (ref 70–99)
Phosphorus: 3 mg/dL (ref 2.8–4.1)
Potassium: 5 mmol/L (ref 3.5–5.2)
Sodium: 139 mmol/L (ref 134–144)
eGFR: 57 mL/min/{1.73_m2} — ABNORMAL LOW (ref >59)

## 2021-03-22 LAB — LIPID PANEL WITH LDL/HDL RATIO
Cholesterol, Total: 198 mg/dL (ref 100–199)
HDL: 32 mg/dL — ABNORMAL LOW (ref >39)
LDL Chol Calc (NIH): 126 mg/dL — ABNORMAL HIGH (ref 0–99)
LDL/HDL Ratio: 3.9 ratio — ABNORMAL HIGH (ref 0.0–3.6)
Triglycerides: 226 mg/dL — ABNORMAL HIGH (ref 0–149)
VLDL Cholesterol Cal: 40 mg/dL (ref 5–40)

## 2021-03-27 LAB — FECAL OCCULT BLOOD, IMMUNOCHEMICAL: Fecal Occult Bld: NEGATIVE

## 2021-09-18 ENCOUNTER — Encounter: Payer: Self-pay | Admitting: Family Medicine

## 2021-09-18 ENCOUNTER — Ambulatory Visit (INDEPENDENT_AMBULATORY_CARE_PROVIDER_SITE_OTHER): Payer: Self-pay | Admitting: Family Medicine

## 2021-09-18 VITALS — BP 129/86 | HR 70 | Ht 69.0 in | Wt 230.0 lb

## 2021-09-18 DIAGNOSIS — E782 Mixed hyperlipidemia: Secondary | ICD-10-CM

## 2021-09-18 DIAGNOSIS — I10 Essential (primary) hypertension: Secondary | ICD-10-CM

## 2021-09-18 MED ORDER — LOSARTAN POTASSIUM-HCTZ 50-12.5 MG PO TABS: 1.0000 | ORAL_TABLET | Freq: Every day | ORAL | 1 refills | Status: DC

## 2021-09-18 NOTE — Patient Instructions (Signed)

## 2021-09-18 NOTE — Progress Notes (Signed)
Date:  09/18/2021   Name:  Isaac [name]   DOB:  [birth year]   MRN:  [id number]   Chief Complaint: Hypertension  Hypertension This is a chronic problem. The current episode started more than 1 year ago. The problem has been gradually improving since onset. The problem is controlled. Pertinent negatives include no anxiety, blurred vision, chest pain, headaches, malaise/fatigue, neck pain, orthopnea, palpitations, peripheral edema, PND, shortness of breath or sweats. There are no associated agents to hypertension. There are no known risk factors for coronary artery disease. Past treatments include angiotensin blockers and diuretics. The current treatment provides moderate improvement. There are no compliance problems.  There is no history of angina, kidney disease, CAD/MI, CVA, heart failure, left ventricular hypertrophy, PVD or retinopathy. There is no history of chronic renal disease, a hypertension causing med or renovascular disease.    Lab Results  Component Value Date   NA 139 03/21/2021   K 5.0 03/21/2021   CO2 21 03/21/2021   GLUCOSE 104 (H) 03/21/2021   BUN 21 03/21/2021   CREATININE 1.47 (H) 03/21/2021   CALCIUM 9.6 03/21/2021   EGFR 57 (L) 03/21/2021   GFRNONAA 65 03/29/2020   Lab Results  Component Value Date   CHOL 198 03/21/2021   HDL 32 (L) 03/21/2021   LDLCALC 126 (H) 03/21/2021   LDLDIRECT 119 (H) 02/16/2020   TRIG 226 (H) 03/21/2021   No results found for: "TSH" No results found for: "HGBA1C" No results found for: "WBC", "HGB", "HCT", "MCV", "PLT" No results found for: "ALT", "AST", "GGT", "ALKPHOS", "BILITOT" No results found for: "25OHVITD2", "25OHVITD3", "VD25OH"   Review of Systems  Constitutional:  Negative for chills, fatigue, fever and malaise/fatigue.  HENT:  Negative for ear pain and sore throat.   Eyes:  Negative for blurred vision.  Respiratory:  Negative for cough, shortness of breath and wheezing.   Cardiovascular:  Negative for chest  pain, palpitations, orthopnea, leg swelling and PND.  Gastrointestinal:  Negative for abdominal pain, blood in stool, constipation, diarrhea and nausea.  Endocrine: Negative for polydipsia.  Genitourinary:  Negative for difficulty urinating, dysuria, frequency, hematuria and urgency.  Musculoskeletal:  Negative for back pain, myalgias and neck pain.  Skin:  Negative for rash.  Allergic/Immunologic: Negative for environmental allergies.  Neurological:  Negative for dizziness and headaches.  Hematological:  Does not bruise/bleed easily.  Psychiatric/Behavioral:  Negative for suicidal ideas. The patient is not nervous/anxious.     There are no problems to display for this patient.   No Known Allergies  Past Surgical History:  Procedure Laterality Date   THROAT SURGERY      Social History   Tobacco Use   Smoking status: Never   Smokeless tobacco: Never  Vaping Use   Vaping Use: Never used  Substance Use Topics   Alcohol use: Not Currently   Drug use: Never     Medication list has been reviewed and updated.  Current Meds  Medication Sig   losartan-hydrochlorothiazide (HYZAAR) 50-12.5 MG tablet Take 1 tablet by mouth daily.       09/18/2021    9:30 AM 03/21/2021    8:59 AM 09/13/2020    8:01 AM 02/16/2020    8:54 AM  GAD 7 : Generalized Anxiety Score  Nervous, Anxious, on Edge 0 0 0 0  Control/stop worrying 0 0 0 0  Worry too much - different things 0 0 0 0  Trouble relaxing 0 0 0 0  Restless 0 0 0 0  Easily annoyed or irritable 1 0 0 0  Afraid - awful might happen 0 0 0 0  Total GAD 7 Score 1 0 0 0  Anxiety Difficulty Not difficult at all Not difficult at all         09/18/2021    9:30 AM 03/21/2021    8:59 AM 09/13/2020    8:01 AM  Depression screen PHQ 2/9  Decreased Interest 0 0 0  Down, Depressed, Hopeless 0 0 0  PHQ - 2 Score 0 0 0  Altered sleeping 1 0 0  Tired, decreased energy 1 0 0  Change in appetite 0 0 0  Feeling bad or failure about yourself   0 0 0  Trouble concentrating 0 0 0  Moving slowly or fidgety/restless 0 0 0  Suicidal thoughts 0 0 0  PHQ-9 Score 2 0 0  Difficult doing work/chores Not difficult at all Not difficult at all     BP Readings from Last 3 Encounters:  09/18/21 132/90  03/21/21 124/80  09/13/20 120/80    Physical Exam Vitals and nursing note reviewed.  HENT:     Head: Normocephalic.     Right Ear: Tympanic membrane and external ear normal.     Left Ear: Tympanic membrane and external ear normal.     Nose: Nose normal. No congestion.     Mouth/Throat:     Mouth: Mucous membranes are moist.     Pharynx: No oropharyngeal exudate.  Eyes:     General: No scleral icterus.       Right eye: No discharge.        Left eye: No discharge.     Conjunctiva/sclera: Conjunctivae normal.     Pupils: Pupils are equal, round, and reactive to light.  Neck:     Thyroid: No thyromegaly.     Vascular: No JVD.     Trachea: No tracheal deviation.  Cardiovascular:     Rate and Rhythm: Normal rate and regular rhythm.     Heart sounds: Normal heart sounds, S1 normal and S2 normal. No murmur heard.    No systolic murmur is present.     No diastolic murmur is present.     No friction rub. No gallop. No S3 or S4 sounds.  Pulmonary:     Effort: Pulmonary effort is normal. No respiratory distress.     Breath sounds: Normal breath sounds. No decreased breath sounds, wheezing, rhonchi or rales.  Abdominal:     General: Bowel sounds are normal.     Palpations: Abdomen is soft. There is no mass.     Tenderness: There is no abdominal tenderness. There is no guarding or rebound.  Musculoskeletal:        General: No tenderness. Normal range of motion.     Cervical back: Normal range of motion and neck supple.  Lymphadenopathy:     Cervical: No cervical adenopathy.  Skin:    General: Skin is warm.     Findings: No rash.  Neurological:     Mental Status: He is alert.     Cranial Nerves: No cranial nerve deficit.      Wt Readings from Last 3 Encounters:  09/18/21 230 lb (104.3 kg)  03/21/21 228 lb (103.4 kg)  09/13/20 229 lb (103.9 kg)    BP 132/90 (BP Location: Left Arm, Cuff Size: Large)   Pulse 70   Ht 5\' 9"  (1.753 m)   Wt 230 lb (104.3 kg)   BMI 33.97 kg/m  Assessment and Plan:  1. Essential hypertension Chronic.  Controlled.  Stable.  Asymptomatic.  Tolerating medication well.  Blood pressure 129/86.  Continue losartan-hydrochlorothiazide 50-12.5 mg once a day.  We will recheck patient in 6 months. - losartan-hydrochlorothiazide (HYZAAR) 50-12.5 MG tablet; Take 1 tablet by mouth daily.  Dispense: 90 tablet; Refill: 1 - Comprehensive Metabolic Panel (CMET)  2. Mixed hyperlipidemia Chronic.  Diet controlled.  Stable.  Weight loss is still in a 34 BMI range.  Patient has been given low-cholesterol diet and encouraged to lose weight as well. - Lipid Panel With LDL/HDL Ratio

## 2021-09-19 ENCOUNTER — Other Ambulatory Visit: Payer: Self-pay

## 2021-09-19 DIAGNOSIS — E782 Mixed hyperlipidemia: Secondary | ICD-10-CM

## 2021-09-19 LAB — COMPREHENSIVE METABOLIC PANEL
ALT: 23 IU/L (ref 0–44)
AST: 17 IU/L (ref 0–40)
Albumin/Globulin Ratio: 1.8 (ref 1.2–2.2)
Albumin: 4.6 g/dL (ref 3.8–4.9)
Alkaline Phosphatase: 89 IU/L (ref 44–121)
BUN/Creatinine Ratio: 13 (ref 9–20)
BUN: 19 mg/dL (ref 6–24)
Bilirubin Total: 0.9 mg/dL (ref 0.0–1.2)
CO2: 23 mmol/L (ref 20–29)
Calcium: 9.5 mg/dL (ref 8.7–10.2)
Chloride: 102 mmol/L (ref 96–106)
Creatinine, Ser: 1.47 mg/dL — ABNORMAL HIGH (ref 0.76–1.27)
Globulin, Total: 2.5 g/dL (ref 1.5–4.5)
Glucose: 111 mg/dL — ABNORMAL HIGH (ref 70–99)
Potassium: 4.6 mmol/L (ref 3.5–5.2)
Sodium: 138 mmol/L (ref 134–144)
Total Protein: 7.1 g/dL (ref 6.0–8.5)
eGFR: 57 mL/min/{1.73_m2} — ABNORMAL LOW (ref >59)

## 2021-09-19 LAB — LIPID PANEL WITH LDL/HDL RATIO
Cholesterol, Total: 196 mg/dL (ref 100–199)
HDL: 33 mg/dL — ABNORMAL LOW (ref >39)
LDL Chol Calc (NIH): 127 mg/dL — ABNORMAL HIGH (ref 0–99)
LDL/HDL Ratio: 3.8 ratio — ABNORMAL HIGH (ref 0.0–3.6)
Triglycerides: 203 mg/dL — ABNORMAL HIGH (ref 0–149)
VLDL Cholesterol Cal: 36 mg/dL (ref 5–40)

## 2021-09-19 MED ORDER — FENOFIBRIC ACID 135 MG PO CPDR: 1.0000 | DELAYED_RELEASE_CAPSULE | Freq: Every day | ORAL | 3 refills | Status: DC

## 2022-03-17 ENCOUNTER — Other Ambulatory Visit: Payer: Self-pay

## 2022-03-17 DIAGNOSIS — I10 Essential (primary) hypertension: Secondary | ICD-10-CM

## 2022-03-17 MED ORDER — LOSARTAN POTASSIUM-HCTZ 50-12.5 MG PO TABS: 1.0000 | ORAL_TABLET | Freq: Every day | ORAL | 0 refills | Status: DC

## 2022-03-21 ENCOUNTER — Ambulatory Visit (INDEPENDENT_AMBULATORY_CARE_PROVIDER_SITE_OTHER): Payer: Self-pay | Admitting: Family Medicine

## 2022-03-21 ENCOUNTER — Encounter: Payer: Self-pay | Admitting: Family Medicine

## 2022-03-21 ENCOUNTER — Telehealth: Payer: Self-pay | Admitting: Family Medicine

## 2022-03-21 ENCOUNTER — Other Ambulatory Visit: Payer: Self-pay

## 2022-03-21 VITALS — BP 138/78 | HR 80 | Ht 69.0 in | Wt 234.0 lb

## 2022-03-21 DIAGNOSIS — G8929 Other chronic pain: Secondary | ICD-10-CM

## 2022-03-21 DIAGNOSIS — N451 Epididymitis: Secondary | ICD-10-CM

## 2022-03-21 DIAGNOSIS — M545 Low back pain, unspecified: Secondary | ICD-10-CM

## 2022-03-21 DIAGNOSIS — E782 Mixed hyperlipidemia: Secondary | ICD-10-CM

## 2022-03-21 DIAGNOSIS — I10 Essential (primary) hypertension: Secondary | ICD-10-CM

## 2022-03-21 DIAGNOSIS — Z1211 Encounter for screening for malignant neoplasm of colon: Secondary | ICD-10-CM

## 2022-03-21 DIAGNOSIS — R7303 Prediabetes: Secondary | ICD-10-CM

## 2022-03-21 MED ORDER — LOSARTAN POTASSIUM-HCTZ 50-12.5 MG PO TABS: 1.0000 | ORAL_TABLET | Freq: Every day | ORAL | 1 refills | Status: DC

## 2022-03-21 MED ORDER — FENOFIBRIC ACID 135 MG PO CPDR: 1.0000 | DELAYED_RELEASE_CAPSULE | Freq: Every day | ORAL | 1 refills | Status: DC

## 2022-03-21 MED ORDER — DOXYCYCLINE HYCLATE 100 MG PO TABS: 100.0000 mg | ORAL_TABLET | Freq: Two times a day (BID) | ORAL | 0 refills | Status: DC

## 2022-03-21 NOTE — Progress Notes (Signed)
Resent meds to [facility] [place]
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
## 2022-03-21 NOTE — Addendum Note (Signed)
Addended by: Fredderick Severance on: 03/21/2022 10:22 AM   Modules accepted: Orders

## 2022-03-21 NOTE — Telephone Encounter (Signed)
Pt pharmacy had some issues with their system this morning and needs pts 3 medications recent asap / please advise

## 2022-03-22 LAB — LIPID PANEL WITH LDL/HDL RATIO
Cholesterol, Total: 201 mg/dL — ABNORMAL HIGH (ref 100–199)
HDL: 33 mg/dL — ABNORMAL LOW (ref >39)
LDL Chol Calc (NIH): 135 mg/dL — ABNORMAL HIGH (ref 0–99)
LDL/HDL Ratio: 4.1 ratio — ABNORMAL HIGH (ref 0.0–3.6)
Triglycerides: 180 mg/dL — ABNORMAL HIGH (ref 0–149)
VLDL Cholesterol Cal: 33 mg/dL (ref 5–40)

## 2022-03-22 LAB — COMPREHENSIVE METABOLIC PANEL
ALT: 41 IU/L (ref 0–44)
AST: 28 IU/L (ref 0–40)
Albumin/Globulin Ratio: 2.1 (ref 1.2–2.2)
Albumin: 5.1 g/dL — ABNORMAL HIGH (ref 3.8–4.9)
Alkaline Phosphatase: 65 IU/L (ref 44–121)
BUN/Creatinine Ratio: 13 (ref 9–20)
BUN: 22 mg/dL (ref 6–24)
Bilirubin Total: 0.8 mg/dL (ref 0.0–1.2)
CO2: 21 mmol/L (ref 20–29)
Calcium: 9.8 mg/dL (ref 8.7–10.2)
Chloride: 102 mmol/L (ref 96–106)
Creatinine, Ser: 1.68 mg/dL — ABNORMAL HIGH (ref 0.76–1.27)
Globulin, Total: 2.4 g/dL (ref 1.5–4.5)
Glucose: 105 mg/dL — ABNORMAL HIGH (ref 70–99)
Potassium: 4.8 mmol/L (ref 3.5–5.2)
Sodium: 140 mmol/L (ref 134–144)
Total Protein: 7.5 g/dL (ref 6.0–8.5)
eGFR: 49 mL/min/{1.73_m2} — ABNORMAL LOW (ref >59)

## 2022-03-22 LAB — HEMOGLOBIN A1C
Est. average glucose Bld gHb Est-mCnc: 126 mg/dL
Hgb A1c MFr Bld: 6 % — ABNORMAL HIGH (ref 4.8–5.6)

## 2022-03-24 ENCOUNTER — Other Ambulatory Visit: Payer: Self-pay

## 2022-03-24 DIAGNOSIS — E782 Mixed hyperlipidemia: Secondary | ICD-10-CM

## 2022-03-24 MED ORDER — ATORVASTATIN CALCIUM 10 MG PO TABS: 10.0000 mg | ORAL_TABLET | Freq: Every day | ORAL | 1 refills | Status: DC

## 2022-03-24 NOTE — Progress Notes (Signed)
Sent in atorv

## 2022-03-26 ENCOUNTER — Other Ambulatory Visit: Payer: Self-pay

## 2022-03-26 ENCOUNTER — Telehealth: Payer: Self-pay

## 2022-03-26 DIAGNOSIS — Z1211 Encounter for screening for malignant neoplasm of colon: Secondary | ICD-10-CM

## 2022-03-26 NOTE — Telephone Encounter (Signed)
Gastroenterology Pre-Procedure Review  Request Date: 06/27/22 Requesting Physician: Dr. Allen Norris  PATIENT REVIEW QUESTIONS: The patient responded to the following health history questions as indicated:    1. Are you having any GI issues? no 2. Do you have a personal history of Polyps? no 3. Do you have a family history of Colon Cancer or Polyps? Yes dad colon cancer 4. Diabetes Mellitus? no 5. Joint replacements in the past 12 months?no 6. Major health problems in the past 3 months?no 7. Any artificial heart valves, MVP, or defibrillator?no    MEDICATIONS & ALLERGIES:    Patient reports the following regarding taking any anticoagulation/antiplatelet therapy:   Plavix, Coumadin, Eliquis, Xarelto, Lovenox, Pradaxa, Brilinta, or Effient? no Aspirin? no  Patient confirms/reports the following medications:  Current Outpatient Medications  Medication Sig Dispense Refill   atorvastatin (LIPITOR) 10 MG tablet Take 1 tablet (10 mg total) by mouth daily. 30 tablet 1   Choline Fenofibrate (FENOFIBRIC ACID) 135 MG CPDR Take 1 capsule by mouth daily. 90 capsule 1   doxycycline (VIBRA-TABS) 100 MG tablet Take 1 tablet (100 mg total) by mouth 2 (two) times daily. 20 tablet 0   losartan-hydrochlorothiazide (HYZAAR) 50-12.5 MG tablet Take 1 tablet by mouth daily. 90 tablet 1   No current facility-administered medications for this visit.    Patient confirms/reports the following allergies:  No Known Allergies  No orders of the defined types were placed in this encounter.   AUTHORIZATION INFORMATION Primary Insurance: 1D#: Group #:  Secondary Insurance: 1D#: Group #:  SCHEDULE INFORMATION: Date: 06/27/22 Time: Location: [place]

## 2022-03-31 ENCOUNTER — Encounter: Payer: Self-pay | Admitting: Family Medicine

## 2022-03-31 ENCOUNTER — Ambulatory Visit (INDEPENDENT_AMBULATORY_CARE_PROVIDER_SITE_OTHER): Payer: Self-pay | Admitting: Family Medicine

## 2022-03-31 VITALS — BP 130/76 | HR 79 | Ht 69.0 in | Wt 235.0 lb

## 2022-03-31 DIAGNOSIS — R3129 Other microscopic hematuria: Secondary | ICD-10-CM

## 2022-03-31 DIAGNOSIS — G8929 Other chronic pain: Secondary | ICD-10-CM

## 2022-03-31 DIAGNOSIS — N451 Epididymitis: Secondary | ICD-10-CM

## 2022-03-31 DIAGNOSIS — Z87442 Personal history of urinary calculi: Secondary | ICD-10-CM

## 2022-03-31 DIAGNOSIS — R109 Unspecified abdominal pain: Secondary | ICD-10-CM

## 2022-03-31 DIAGNOSIS — M545 Low back pain, unspecified: Secondary | ICD-10-CM

## 2022-03-31 LAB — POCT URINALYSIS DIPSTICK
Bilirubin, UA: NEGATIVE
Glucose, UA: NEGATIVE
Ketones, UA: NEGATIVE
Leukocytes, UA: NEGATIVE
Nitrite, UA: NEGATIVE
Protein, UA: NEGATIVE
Spec Grav, UA: 1.02 (ref 1.010–1.025)
Urobilinogen, UA: 0.2 E.U./dL
pH, UA: 6 (ref 5.0–8.0)
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
## 2022-04-01 ENCOUNTER — Ambulatory Visit: Payer: Self-pay | Admitting: Family Medicine

## 2022-04-01 ENCOUNTER — Encounter: Payer: Self-pay | Admitting: Family Medicine

## 2022-06-19 ENCOUNTER — Encounter: Payer: Self-pay | Admitting: Gastroenterology

## 2022-06-20 ENCOUNTER — Telehealth: Payer: Self-pay

## 2022-06-20 MED ORDER — NA SULFATE-K SULFATE-MG SULF 17.5-3.13-1.6 GM/177ML PO SOLN: 1.0000 | Freq: Once | ORAL | 0 refills | Status: AC

## 2022-06-20 NOTE — Telephone Encounter (Signed)
Received message from [place] at [facility].  Patient rx not sent to pharmacy.  Contacted patient to let him know his prep has been sent to [facility] in [place].  Thanks, [place], [place]

## 2022-06-27 ENCOUNTER — Encounter: Payer: Self-pay | Admitting: Gastroenterology

## 2022-06-27 ENCOUNTER — Encounter
Admission: RE | Disposition: A | Discharge status: Home / Self Care | Payer: Self-pay | Source: Home / Self Care | Attending: Gastroenterology

## 2022-06-27 ENCOUNTER — Ambulatory Visit: Payer: Self-pay | Admitting: Anesthesiology

## 2022-06-27 ENCOUNTER — Other Ambulatory Visit: Payer: Self-pay

## 2022-06-27 ENCOUNTER — Ambulatory Visit
Admission: RE | Admit: 2022-06-27 | Discharge: 2022-06-27 | Disposition: A | Discharge status: Home / Self Care | Payer: Self-pay | Attending: Gastroenterology | Admitting: Gastroenterology

## 2022-06-27 DIAGNOSIS — I10 Essential (primary) hypertension: Secondary | ICD-10-CM | POA: Insufficient documentation

## 2022-06-27 DIAGNOSIS — K219 Gastro-esophageal reflux disease without esophagitis: Secondary | ICD-10-CM | POA: Insufficient documentation

## 2022-06-27 DIAGNOSIS — D124 Benign neoplasm of descending colon: Secondary | ICD-10-CM | POA: Insufficient documentation

## 2022-06-27 DIAGNOSIS — Z1211 Encounter for screening for malignant neoplasm of colon: Secondary | ICD-10-CM

## 2022-06-27 DIAGNOSIS — K635 Polyp of colon: Secondary | ICD-10-CM

## 2022-06-27 HISTORY — PX: POLYPECTOMY: SHX5525

## 2022-06-27 HISTORY — DX: Gastro-esophageal reflux disease without esophagitis: K21.9

## 2022-06-27 HISTORY — PX: COLONOSCOPY WITH PROPOFOL: SHX5780

## 2022-06-27 SURGERY — COLONOSCOPY WITH PROPOFOL
Anesthesia: General | Site: Rectum

## 2022-06-27 MED ORDER — LACTATED RINGERS IV SOLN: INTRAVENOUS | Status: DC

## 2022-06-27 MED ORDER — PROPOFOL 10 MG/ML IV BOLUS
INTRAVENOUS | Status: DC | PRN
  Administered 2022-06-27: 20 mg via INTRAVENOUS
  Administered 2022-06-27: 30 mg via INTRAVENOUS
  Administered 2022-06-27: 20 mg via INTRAVENOUS
  Administered 2022-06-27: 40 mg via INTRAVENOUS
  Administered 2022-06-27: 80 mg via INTRAVENOUS
  Administered 2022-06-27: 30 mg via INTRAVENOUS
  Administered 2022-06-27: 20 mg via INTRAVENOUS

## 2022-06-27 MED ORDER — STERILE WATER FOR IRRIGATION IR SOLN
Status: DC | PRN
  Administered 2022-06-27: 150 mL

## 2022-06-27 MED ORDER — SODIUM CHLORIDE 0.9 % IV SOLN: INTRAVENOUS | Status: DC

## 2022-06-27 MED ORDER — LIDOCAINE HCL (CARDIAC) PF 100 MG/5ML IV SOSY
PREFILLED_SYRINGE | INTRAVENOUS | Status: DC | PRN
  Administered 2022-06-27: 50 mg via INTRAVENOUS

## 2022-06-27 SURGICAL SUPPLY — 21 items

## 2022-06-27 NOTE — Op Note (Signed)
[facility]astroenterology Patient Name: Isaac [name] Procedure Date: 06/27/2022 7:48 AM MRN: [id number] Account #: [id number] Date of Birth: [birth year] Admit Type: Outpatient Age: 52 Room: [facility] OR ROOM 01 Gender: Male Note Status: Finalized Instrument Name: [id number] Procedure:             Colonoscopy Indications:           Screening for colorectal malignant neoplasm Providers:             Midge Minium MD, MD Referring MD:          Duanne Limerick, MD (Referring MD) Medicines:             Propofol per Anesthesia Complications:         No immediate complications. Procedure:             Pre-Anesthesia Assessment:                        - Prior to the procedure, a History and Physical was                         performed, and patient medications and allergies were                         reviewed. The patient's tolerance of previous                         anesthesia was also reviewed. The risks and benefits                         of the procedure and the sedation options and risks                         were discussed with the patient. All questions were                         answered, and informed consent was obtained. Prior                         Anticoagulants: The patient has taken no anticoagulant                         or antiplatelet agents. ASA Grade Assessment: II - A                         patient with mild systemic disease. After reviewing                         the risks and benefits, the patient was deemed in                         satisfactory condition to undergo the procedure.                        After obtaining informed consent, the colonoscope was                         passed under direct vision. Throughout the procedure,  the patient's blood pressure, pulse, and oxygen                         saturations were monitored continuously. The                         Colonoscope was introduced through the anus  and                         advanced to the the cecum, identified by appendiceal                         orifice and ileocecal valve. The colonoscopy was                         performed without difficulty. The patient tolerated                         the procedure well. The quality of the bowel                         preparation was excellent. Findings:      The perianal and digital rectal examinations were normal.      A 5 mm polyp was found in the descending colon. The polyp was sessile.       The polyp was removed with a cold snare. Resection and retrieval were       complete. Impression:            - One 5 mm polyp in the descending colon, removed with                         a cold snare. Resected and retrieved. Recommendation:        - Discharge patient to home.                        - Resume previous diet.                        - Continue present medications.                        - Await pathology results. Procedure Code(s):     --- Professional ---                        [phone], Colonoscopy, flexible; with removal of                         tumor(s), polyp(s), or other lesion(s) by snare                         technique Diagnosis Code(s):     --- Professional ---                        Z12.11, Encounter for screening for malignant neoplasm                         of colon  D12.4, Benign neoplasm of descending colon CPT copyright 2022 [employer]. All rights reserved. The codes documented in this report are preliminary and upon coder review may  be revised to meet current compliance requirements. Midge Minium MD, MD 06/27/2022 8:16:12 AM This report has been signed electronically. Number of Addenda: 0 Note Initiated On: 06/27/2022 7:48 AM Scope Withdrawal Time: 0 hours 9 minutes 29 seconds  Total Procedure Duration: 0 hours 11 minutes 52 seconds  Estimated Blood Loss:  Estimated blood loss: none.      [facility]

## 2022-06-27 NOTE — H&P (Signed)
Isaac Minium, [facility] [address], [address] [place], [place] [address] Phone: [phone] Fax : [phone]  Primary Care Physician:  Duanne Limerick, Isaac Primary Gastroenterologist:  Dr. Servando Snare  Pre-Procedure History & Physical: HPI:  Isaac [name] is a 52 y.o. male is here for a screening colonoscopy.   Past Medical History:  Diagnosis Date   Hypertension     Past Surgical History:  Procedure Laterality Date   THROAT SURGERY      Prior to Admission medications   Medication Sig Start Date End Date Taking? Authorizing Provider  Choline Fenofibrate (FENOFIBRIC ACID) 135 MG CPDR Take 1 capsule by mouth daily. 03/21/22  Yes Duanne Limerick, Isaac  losartan-hydrochlorothiazide (HYZAAR) 50-12.5 MG tablet Take 1 tablet by mouth daily. 03/21/22  Yes Duanne Limerick, Isaac  omeprazole (PRILOSEC) 10 MG capsule Take 10 mg by mouth daily.   Yes Provider, Historical, Isaac  atorvastatin (LIPITOR) 10 MG tablet Take 1 tablet (10 mg total) by mouth daily. Patient not taking: Reported on 06/19/2022 03/24/22   Duanne Limerick, Isaac  doxycycline (VIBRA-TABS) 100 MG tablet Take 1 tablet (100 mg total) by mouth 2 (two) times daily. Patient not taking: Reported on 06/19/2022 03/21/22   Duanne Limerick, Isaac    Allergies as of 03/26/2022   (No Known Allergies)    Family History  Problem Relation Age of Onset   Stroke Mother    Cancer Mother    Hypertension Mother    Hypertension Father    Stroke Father    Diabetes Father    Heart disease Father    Hypertension Sister     Social History   Socioeconomic History   Marital status: Married    Spouse name: Not on file   Number of children: Not on file   Years of education: Not on file   Highest education level: Not on file  Occupational History   Not on file  Tobacco Use   Smoking status: Never   Smokeless tobacco: Never  Vaping Use   Vaping Use: Never used  Substance and Sexual Activity   Alcohol use: Not Currently   Drug use: Never    Sexual activity: Not Currently  Other Topics Concern   Not on file  Social History Narrative   Not on file   Social Determinants of Health   Financial Resource Strain: Not on file  Food Insecurity: Not on file  Transportation Needs: Not on file  Physical Activity: Not on file  Stress: Not on file  Social Connections: Not on file  Intimate Partner Violence: Not on file    Review of Systems: See HPI, otherwise negative ROS  Physical Exam: BP 109/80   Pulse 73   Temp 98.1 F (36.7 C) (Temporal)   Resp 18   Ht 5\' 9"  (1.753 m)   Wt 99.3 kg   SpO2 100%   BMI 32.34 kg/m  General:   Alert,  pleasant and cooperative in NAD Head:  Normocephalic and atraumatic. Neck:  Supple; no masses or thyromegaly. Lungs:  Clear throughout to auscultation.    Heart:  Regular rate and rhythm. Abdomen:  Soft, nontender and nondistended. Normal bowel sounds, without guarding, and without rebound.   Neurologic:  Alert and  oriented x4;  grossly normal neurologically.  Impression/Plan: [name] is now here to undergo a screening colonoscopy.  Risks, benefits, and alternatives regarding colonoscopy have been reviewed with the patient.  Questions have been answered.  All parties agreeable.

## 2022-06-27 NOTE — Anesthesia Preprocedure Evaluation (Signed)
Anesthesia Evaluation  Patient identified by MRN, date of birth, ID band Patient awake    Reviewed: Allergy & Precautions, H&P , NPO status , Patient's Chart, lab work & pertinent test results  Airway Mallampati: III  TM Distance: >3 FB Neck ROM: Full    Dental no notable dental hx.    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"   Pulmonary exam normal breath sounds clear to auscultation       Cardiovascular hypertension, Normal cardiovascular exam Rhythm:Regular Rate:Normal     Neuro/Psych negative neurological ROS  negative psych ROS   GI/Hepatic Neg liver ROS,GERD  Medicated,,Severe GERD, note hoarse voice   Endo/Other  negative endocrine ROS    Renal/GU negative Renal ROS  negative genitourinary   Musculoskeletal negative musculoskeletal ROS (+)    Abdominal   Peds negative pediatric ROS (+)  Hematology negative hematology ROS (+)   Anesthesia Other Findings   Reproductive/Obstetrics negative OB ROS                             Anesthesia Physical Anesthesia Plan  ASA: 2  Anesthesia Plan: General   Post-op Pain Management:    Induction: Intravenous  PONV Risk Score and Plan:   Airway Management Planned: Natural Airway and Nasal Cannula  Additional Equipment:   Intra-op Plan:   Post-operative Plan:   Informed Consent: I have reviewed the patients History and Physical, chart, labs and discussed the procedure including the risks, benefits and alternatives for the proposed anesthesia with the patient or authorized representative who has indicated his/her understanding and acceptance.     Dental Advisory Given  Plan Discussed with: Anesthesiologist, CRNA and Surgeon  Anesthesia Plan Comments: (Patient consented for risks of anesthesia including but not limited to:  - adverse reactions to medications - risk of airway placement if  required - damage to eyes, teeth, lips or other oral mucosa - nerve damage due to positioning  - sore throat or hoarseness - Damage to heart, brain, nerves, lungs, other parts of body or loss of life  Patient voiced understanding.)       Anesthesia Quick Evaluation

## 2022-06-27 NOTE — Anesthesia Postprocedure Evaluation (Signed)
Anesthesia Post Note  Patient: Isaac [name]  Procedure(s) Performed: COLONOSCOPY WITH PROPOFOL (Rectum) POLYPECTOMY (Rectum)  Patient location during evaluation: PACU Anesthesia Type: General Level of consciousness: awake and alert Pain management: pain level controlled Vital Signs Assessment: post-procedure vital signs reviewed and stable Respiratory status: spontaneous breathing, nonlabored ventilation, respiratory function stable and patient connected to nasal cannula oxygen Cardiovascular status: blood pressure returned to baseline and stable Postop Assessment: no apparent nausea or vomiting Anesthetic complications: no   No notable events documented.   Last Vitals:  Vitals:   06/27/22 0828 06/27/22 0830  BP: 100/62 100/62  Pulse:  64  Resp:  17  Temp:    SpO2:  97%    Last Pain:  Vitals:   06/27/22 0828  TempSrc:   PainSc: 0-No pain                 Marisue Humble

## 2022-06-27 NOTE — Transfer of Care (Signed)
Immediate Anesthesia Transfer of Care Note  Patient: Isaac [name]  Procedure(s) Performed: COLONOSCOPY WITH PROPOFOL (Rectum) POLYPECTOMY (Rectum)  Patient Location: PACU  Anesthesia Type: General  Level of Consciousness: awake, alert  and patient cooperative  Airway and Oxygen Therapy: Patient Spontanous Breathing and Patient connected to supplemental oxygen  Post-op Assessment: Post-op Vital signs reviewed, Patient's Cardiovascular Status Stable, Respiratory Function Stable, Patent Airway and No signs of Nausea or vomiting  Post-op Vital Signs: Reviewed and stable  Complications: No notable events documented.

## 2022-06-30 ENCOUNTER — Encounter: Payer: Self-pay | Admitting: Gastroenterology

## 2022-06-30 LAB — SURGICAL PATHOLOGY

## 2022-07-08 ENCOUNTER — Encounter: Payer: Self-pay | Admitting: Gastroenterology

## 2022-07-08 NOTE — Addendum Note (Signed)
Addendum  created 07/08/22 1039 by Emeterio Reeve, CRNA   Intraprocedure Event edited

## 2022-09-19 ENCOUNTER — Ambulatory Visit (INDEPENDENT_AMBULATORY_CARE_PROVIDER_SITE_OTHER): Payer: Self-pay | Admitting: Family Medicine

## 2022-09-19 ENCOUNTER — Encounter: Payer: Self-pay | Admitting: Family Medicine

## 2022-09-19 VITALS — BP 110/64 | HR 66 | Ht 69.0 in | Wt 226.0 lb

## 2022-09-19 DIAGNOSIS — R3129 Other microscopic hematuria: Secondary | ICD-10-CM

## 2022-09-19 DIAGNOSIS — I10 Essential (primary) hypertension: Secondary | ICD-10-CM

## 2022-09-19 DIAGNOSIS — E782 Mixed hyperlipidemia: Secondary | ICD-10-CM

## 2022-09-19 LAB — POCT URINALYSIS DIPSTICK
Bilirubin, UA: NEGATIVE
Glucose, UA: NEGATIVE
Ketones, UA: NEGATIVE
Leukocytes, UA: NEGATIVE
Nitrite, UA: NEGATIVE
Protein, UA: NEGATIVE
Spec Grav, UA: 1.01 (ref 1.010–1.025)
Urobilinogen, UA: 0.2 E.U./dL
pH, UA: 6 (ref 5.0–8.0)

## 2022-09-19 NOTE — Progress Notes (Addendum)
Date:  09/19/2022   Name:  Isaac [name]   DOB:  October 22, 1970   MRN:  [id number]   Chief Complaint: Hyperlipidemia and Hypertension  HPI  Lab Results  Component Value Date   NA 140 03/21/2022   K 4.8 03/21/2022   CO2 21 03/21/2022   GLUCOSE 105 (H) 03/21/2022   BUN 22 03/21/2022   CREATININE 1.68 (H) 03/21/2022   CALCIUM 9.8 03/21/2022   EGFR 49 (L) 03/21/2022   GFRNONAA 65 03/29/2020   Lab Results  Component Value Date   CHOL 201 (H) 03/21/2022   HDL 33 (L) 03/21/2022   LDLCALC 135 (H) 03/21/2022   LDLDIRECT 119 (H) 02/16/2020   TRIG 180 (H) 03/21/2022   No results found for: "TSH" Lab Results  Component Value Date   HGBA1C 6.0 (H) 03/21/2022   No results found for: "WBC", "HGB", "HCT", "MCV", "PLT" Lab Results  Component Value Date   ALT 41 03/21/2022   AST 28 03/21/2022   ALKPHOS 65 03/21/2022   BILITOT 0.8 03/21/2022   No results found for: "25OHVITD2", "25OHVITD3", "VD25OH"   Review of Systems  Patient Active Problem List   Diagnosis Date Noted   Encounter for screening colonoscopy 06/27/2022   Polyp of descending colon 06/27/2022    No Known Allergies  Past Surgical History:  Procedure Laterality Date   COLONOSCOPY WITH PROPOFOL N/A 06/27/2022   Procedure: COLONOSCOPY WITH PROPOFOL;  Surgeon: Midge Minium, MD;  [facility];  Service: Endoscopy;  Laterality: N/A;   POLYPECTOMY  06/27/2022   Procedure: POLYPECTOMY;  Surgeon: Midge Minium, MD;  Location: [facility];  Service: Endoscopy;;   THROAT SURGERY      Social History   Tobacco Use   Smoking status: Never   Smokeless tobacco: Never  Vaping Use   Vaping status: Never Used  Substance Use Topics   Alcohol use: Not Currently   Drug use: Never     Medication list has been reviewed and updated.  Current Meds  Medication Sig   Choline Fenofibrate (FENOFIBRIC ACID) 135 MG CPDR Take 1 capsule by mouth daily.   losartan-hydrochlorothiazide (HYZAAR) 50-12.5  MG tablet Take 1 tablet by mouth daily.   omeprazole (PRILOSEC) 10 MG capsule Take 10 mg by mouth daily.       09/19/2022    9:02 AM 03/31/2022    4:37 PM 03/21/2022    9:32 AM 09/18/2021    9:30 AM  GAD 7 : Generalized Anxiety Score  Nervous, Anxious, on Edge 0 0 0 0  Control/stop worrying 0 0 0 0  Worry too much - different things 0 0 0 0  Trouble relaxing 0 0 0 0  Restless 0 0 0 0  Easily annoyed or irritable 0 0 0 1  Afraid - awful might happen 0 0 0 0  Total GAD 7 Score 0 0 0 1  Anxiety Difficulty Not difficult at all Not difficult at all Not difficult at all Not difficult at all       09/19/2022    9:02 AM 03/31/2022    4:37 PM 03/21/2022    9:32 AM  Depression screen PHQ 2/9  Decreased Interest 0 0 0  Down, Depressed, Hopeless 0 0 0  PHQ - 2 Score 0 0 0  Altered sleeping 0 0 0  Tired, decreased energy 0 0 0  Change in appetite 0 0 0  Feeling bad or failure about yourself  0 0 0  Trouble concentrating 0  0 0  Moving slowly or fidgety/restless 0 0 0  Suicidal thoughts 0 0 0  PHQ-9 Score 0 0 0  Difficult doing work/chores Not difficult at all Not difficult at all Not difficult at all    BP Readings from Last 3 Encounters:  09/19/22 110/64  06/27/22 100/62  03/31/22 130/76    Physical Exam  Wt Readings from Last 3 Encounters:  09/19/22 226 lb (102.5 kg)  06/27/22 219 lb (99.3 kg)  03/31/22 235 lb (106.6 kg)    BP 110/64   Pulse 66   Ht 5\' 9"  (1.753 m)   Wt 226 lb (102.5 kg)   SpO2 99%   BMI 33.37 kg/m   Assessment and Plan: 1. Essential hypertension See written note  2. Mixed hyperlipidemia   3. Other microscopic hematuria See note I have been unable to reach this patient by phone.  A letter is being sent. unable to go to urology due to work schedule as best can be determined.. - POCT Urinalysis Dipstick unremarkable/trace erythrocytes    Elizabeth Sauer, MD

## 2022-11-21 ENCOUNTER — Other Ambulatory Visit: Payer: Self-pay | Admitting: Family Medicine

## 2022-11-21 DIAGNOSIS — E782 Mixed hyperlipidemia: Secondary | ICD-10-CM

## 2022-11-21 DIAGNOSIS — I10 Essential (primary) hypertension: Secondary | ICD-10-CM

## 2022-11-21 NOTE — Telephone Encounter (Unsigned)
Copied from CRM [id number]. Topic: General - Other >> Nov 21, 2022  2:46 PM Everette C wrote: Reason for CRM: Medication Refill - Medication: losartan-hydrochlorothiazide (HYZAAR) 50-12.5 MG tablet [[id number]]  Choline Fenofibrate (FENOFIBRIC ACID) 135 MG CPDR [[id number]]  Has the patient contacted their pharmacy? Yes.   (Agent: If no, request that the patient contact the pharmacy for the refill. If patient does not wish to contact the pharmacy document the reason why and proceed with request.) (Agent: If yes, when and what did the pharmacy advise?)  Preferred Pharmacy (with phone number or street name): [id number], [place] - [address] AT [facility] OF [address] [place] [address] Phone: [phone] Fax: [phone] Hours: Not open 24 hours   Has the patient been seen for an appointment in the last year OR does the patient have an upcoming appointment? Yes.    Agent: Please be advised that RX refills may take up to 3 business days. We ask that you follow-up with your pharmacy.

## 2022-11-24 MED ORDER — FENOFIBRIC ACID 135 MG PO CPDR
1.0000 | DELAYED_RELEASE_CAPSULE | Freq: Every day | ORAL | 0 refills | Status: DC
Start: 2022-11-24 — End: 2023-02-18

## 2022-11-24 MED ORDER — LOSARTAN POTASSIUM-HCTZ 50-12.5 MG PO TABS
1.0000 | ORAL_TABLET | Freq: Every day | ORAL | 1 refills | Status: DC
Start: 2022-11-24 — End: 2023-03-10

## 2022-11-24 NOTE — Telephone Encounter (Signed)
Requested Prescriptions  Pending Prescriptions Disp Refills   losartan-hydrochlorothiazide (HYZAAR) 50-12.5 MG tablet 90 tablet 1    Sig: Take 1 tablet by mouth daily.     Cardiovascular: ARB + Diuretic Combos Failed - 11/21/2022  3:09 PM      Failed - K in normal range and within 180 days    Potassium  Date Value Ref Range Status  03/21/2022 4.8 3.5 - 5.2 mmol/L Final         Failed - Na in normal range and within 180 days    Sodium  Date Value Ref Range Status  03/21/2022 140 134 - 144 mmol/L Final         Failed - Cr in normal range and within 180 days    Creatinine, Ser  Date Value Ref Range Status  03/21/2022 1.68 (H) 0.76 - 1.27 mg/dL Final         Failed - eGFR is 10 or above and within 180 days    GFR calc Af Amer  Date Value Ref Range Status  03/29/2020 76 >59 mL/min/1.73 Final    Comment:    **In accordance with recommendations from the NKF-ASN Task force,**   [facility] is in the process of updating its eGFR calculation to the   2021 CKD-EPI creatinine equation that estimates kidney function   without a race variable.    GFR calc non Af Amer  Date Value Ref Range Status  03/29/2020 65 >59 mL/min/1.73 Final   eGFR  Date Value Ref Range Status  03/21/2022 49 (L) >59 mL/min/1.73 Final         Passed - Patient is not pregnant      Passed - Last BP in normal range    BP Readings from Last 1 Encounters:  09/19/22 110/64         Passed - Valid encounter within last 6 months    Recent Outpatient Visits           2 months ago Essential hypertension   [place] [facility]   7 months ago Chronic right-sided low back pain, unspecified whether sciatica present   [facility]   8 months ago Essential hypertension   [place] [facility]   1 year ago Mixed hyperlipidemia    [place] [facility], [facility]   1 year ago Essential hypertension    [facility], [facility]       Future Appointments             In 3 months Duanne Limerick, MD [facility] at [facility], [facility]             Choline Fenofibrate (FENOFIBRIC ACID) 135 MG CPDR 90 capsule 1    Sig: Take 1 capsule by mouth daily.     Cardiovascular:  Antilipid - Fibric Acid Derivatives Failed - 11/21/2022  3:09 PM      Failed - Cr in normal range and within 360 days    Creatinine, Ser  Date Value Ref Range Status  03/21/2022 1.68 (H) 0.76 - 1.27 mg/dL Final         Failed - HGB in normal range and within 360 days  No results found for: "HGB", "HGBKUC", "HGBPOCKUC", "HGBOTHER", "TOTHGB", "HGBPLASMA"       Failed - HCT in normal range and within 360 days    No results found for: "HCT", "HCTKUC", "SRHCT"       Failed - PLT in normal range and within 360 days    No results found for: "PLT", "PLTCOUNTKUC", "LABPLAT", "POCPLA"       Failed - WBC in normal range and within 360 days    No results found for: "WBC", "WBCKUC"       Failed - Lipid Panel in normal range within the last 12 months    Cholesterol, Total  Date Value Ref Range Status  03/21/2022 201 (H) 100 - 199 mg/dL Final   LDL Chol Calc (NIH)  Date Value Ref Range Status  03/21/2022 135 (H) 0 - 99 mg/dL Final   LDL Direct  Date Value Ref Range Status  02/16/2020 119 (H) 0 - 99 mg/dL Final   HDL  Date Value Ref Range Status  03/21/2022 33 (L) >39 mg/dL Final   Triglycerides  Date Value Ref Range Status  03/21/2022 180 (H) 0 - 149 mg/dL Final         Passed - ALT in normal range and within 360 days    ALT  Date Value Ref Range Status  03/21/2022 41 0 - 44 IU/L Final         Passed - AST in normal range and within 360 days    AST  Date Value Ref Range Status  03/21/2022 28  0 - 40 IU/L Final         Passed - eGFR is 30 or above and within 360 days    GFR calc Af Amer  Date Value Ref Range Status  03/29/2020 76 >59 mL/min/1.73 Final    Comment:    **In accordance with recommendations from the NKF-ASN Task force,**   [facility] is in the process of updating its eGFR calculation to the   2021 CKD-EPI creatinine equation that estimates kidney function   without a race variable.    GFR calc non Af Amer  Date Value Ref Range Status  03/29/2020 65 >59 mL/min/1.73 Final   eGFR  Date Value Ref Range Status  03/21/2022 49 (L) >59 mL/min/1.73 Final         Passed - Valid encounter within last 12 months    Recent Outpatient Visits           2 months ago Essential hypertension   [place] [facility]   7 months ago Chronic right-sided low back pain, unspecified whether sciatica present   [facility]   8 months ago Essential hypertension   [place] [facility]   1 year ago Mixed hyperlipidemia   [place] [facility] MD   1 year ago Essential hypertension   [place] [facility], [facility]       Future Appointments             In 3 months Duanne Limerick, MD [facility] & [facility] Medicine at [facility], [facility]

## 2022-11-24 NOTE — Telephone Encounter (Signed)
Requested medications are due for refill today.  yes  Requested medications are on the active medications list.  yes  Last refill. 03/21/2022 #90 1 rf  Future visit scheduled.   yes  Notes to clinic.  Missing labs - CBC.    Requested Prescriptions  Pending Prescriptions Disp Refills   Choline Fenofibrate (FENOFIBRIC ACID) 135 MG CPDR 90 capsule 1    Sig: Take 1 capsule by mouth daily.     Cardiovascular:  Antilipid - Fibric Acid Derivatives Failed - 11/21/2022  3:09 PM      Failed - Cr in normal range and within 360 days    Creatinine, Ser  Date Value Ref Range Status  03/21/2022 1.68 (H) 0.76 - 1.27 mg/dL Final         Failed - HGB in normal range and within 360 days    No results found for: "HGB", "HGBKUC", "HGBPOCKUC", "HGBOTHER", "TOTHGB", "HGBPLASMA"       Failed - HCT in normal range and within 360 days    No results found for: "HCT", "HCTKUC", "SRHCT"       Failed - PLT in normal range and within 360 days    No results found for: "PLT", "PLTCOUNTKUC", "LABPLAT", "POCPLA"       Failed - WBC in normal range and within 360 days    No results found for: "WBC", "WBCKUC"       Failed - Lipid Panel in normal range within the last 12 months    Cholesterol, Total  Date Value Ref Range Status  03/21/2022 201 (H) 100 - 199 mg/dL Final   LDL Chol Calc (NIH)  Date Value Ref Range Status  03/21/2022 135 (H) 0 - 99 mg/dL Final   LDL Direct  Date Value Ref Range Status  02/16/2020 119 (H) 0 - 99 mg/dL Final   HDL  Date Value Ref Range Status  03/21/2022 33 (L) >39 mg/dL Final   Triglycerides  Date Value Ref Range Status  03/21/2022 180 (H) 0 - 149 mg/dL Final         Passed - ALT in normal range and within 360 days    ALT  Date Value Ref Range Status  03/21/2022 41 0 - 44 IU/L Final         Passed - AST in normal range and within 360 days    AST  Date Value Ref Range Status  03/21/2022 28 0 - 40 IU/L Final         Passed - eGFR is 30 or above and within  360 days    GFR calc Af Amer  Date Value Ref Range Status  03/29/2020 76 >59 mL/min/1.73 Final    Comment:    **In accordance with recommendations from the NKF-ASN Task force,**   [facility] is in the process of updating its eGFR calculation to the   2021 CKD-EPI creatinine equation that estimates kidney function   without a race variable.    GFR calc non Af Amer  Date Value Ref Range Status  03/29/2020 65 >59 mL/min/1.73 Final   eGFR  Date Value Ref Range Status  03/21/2022 49 (L) >59 mL/min/1.73 Final         Passed - Valid encounter within last 12 months    Recent Outpatient Visits           2 months ago Essential hypertension    [facility]   7 months ago Chronic  right-sided low back pain, unspecified whether sciatica present   [facility]   8 months ago Essential hypertension   [place] [facility]   1 year ago Mixed hyperlipidemia   [place] [facility] MD   1 year ago Essential hypertension   [place] [facility]       Future Appointments             In 3 months Duanne Limerick, MD [facility] & [facility] Medicine at [facility], [facility]            Signed Prescriptions Disp Refills   losartan-hydrochlorothiazide (HYZAAR) 50-12.5 MG tablet 90 tablet 1    Sig: Take 1 tablet by mouth daily.     Cardiovascular: ARB + Diuretic Combos Failed - 11/21/2022  3:09 PM      Failed - K in normal range and within 180 days    Potassium  Date Value Ref Range Status  03/21/2022 4.8 3.5 - 5.2 mmol/L Final         Failed - Na in normal range and within 180 days    Sodium  Date Value Ref Range Status  03/21/2022 140 134 - 144 mmol/L Final          Failed - Cr in normal range and within 180 days    Creatinine, Ser  Date Value Ref Range Status  03/21/2022 1.68 (H) 0.76 - 1.27 mg/dL Final         Failed - eGFR is 10 or above and within 180 days    GFR calc Af Amer  Date Value Ref Range Status  03/29/2020 76 >59 mL/min/1.73 Final    Comment:    **In accordance with recommendations from the NKF-ASN Task force,**   [facility] is in the process of updating its eGFR calculation to the   2021 CKD-EPI creatinine equation that estimates kidney function   without a race variable.    GFR calc non Af Amer  Date Value Ref Range Status  03/29/2020 65 >59 mL/min/1.73 Final   eGFR  Date Value Ref Range Status  03/21/2022 49 (L) >59 mL/min/1.73 Final         Passed - Patient is not pregnant      Passed - Last BP in normal range    BP Readings from Last 1 Encounters:  09/19/22 110/64         Passed - Valid encounter within last 6 months    Recent Outpatient Visits           2 months ago Essential hypertension   [place] [facility]   7 months ago Chronic right-sided low back pain, unspecified whether sciatica present   [facility]   8 months ago Essential hypertension   [place] [facility]   1 year ago Mixed hyperlipidemia   [place] [facility]   1 year ago Essential hypertension   [place] [facility] MD  Future Appointments             In 3 months Yetta Barre, Vanita Panda, MD [facility] at [facility], [facility]

## 2023-02-18 ENCOUNTER — Other Ambulatory Visit: Payer: Self-pay | Admitting: Family Medicine

## 2023-02-18 DIAGNOSIS — E782 Mixed hyperlipidemia: Secondary | ICD-10-CM

## 2023-03-10 ENCOUNTER — Ambulatory Visit (INDEPENDENT_AMBULATORY_CARE_PROVIDER_SITE_OTHER): Payer: Self-pay | Admitting: Family Medicine

## 2023-03-10 ENCOUNTER — Encounter: Payer: Self-pay | Admitting: Family Medicine

## 2023-03-10 VITALS — BP 128/77 | HR 82 | Ht 69.0 in | Wt 234.0 lb

## 2023-03-10 DIAGNOSIS — I10 Essential (primary) hypertension: Secondary | ICD-10-CM

## 2023-03-10 DIAGNOSIS — E782 Mixed hyperlipidemia: Secondary | ICD-10-CM

## 2023-03-10 DIAGNOSIS — R7303 Prediabetes: Secondary | ICD-10-CM

## 2023-03-10 MED ORDER — LOSARTAN POTASSIUM-HCTZ 50-12.5 MG PO TABS: 1.0000 | ORAL_TABLET | Freq: Every day | ORAL | 1 refills | Status: DC

## 2023-03-10 MED ORDER — FENOFIBRIC ACID 135 MG PO CPDR
1.0000 | DELAYED_RELEASE_CAPSULE | Freq: Every day | ORAL | 1 refills | Status: DC
Start: 2023-03-10 — End: 2023-07-23

## 2023-03-10 NOTE — Progress Notes (Signed)
 Date:  03/10/2023   Name:  Isaac [name]   DOB:  20-May-1970   MRN:  [id number]   Chief Complaint: Hypertension and Hyperlipidemia  Hypertension This is a chronic problem. The current episode started more than 1 year ago. The problem has been waxing and waning since onset. The problem is controlled. Pertinent negatives include no anxiety, blurred vision, chest pain, headaches, malaise/fatigue, neck pain, orthopnea, palpitations, peripheral edema, PND or shortness of breath. There are no associated agents to hypertension. Risk factors for coronary artery disease include dyslipidemia. Past treatments include angiotensin blockers and diuretics. The current treatment provides moderate improvement. There are no compliance problems.  There is no history of CAD/MI or CVA. There is no history of chronic renal disease, a hypertension causing med or renovascular disease.  Hyperlipidemia This is a chronic problem. The current episode started more than 1 year ago. The problem is controlled. Recent lipid tests were reviewed and are normal. He has no history of chronic renal disease, diabetes, hypothyroidism, liver disease, obesity or nephrotic syndrome. Pertinent negatives include no chest pain, focal sensory loss, focal weakness, leg pain, myalgias or shortness of breath. Current antihyperlipidemic treatment includes fibric acid derivatives. The current treatment provides moderate improvement of lipids. There are no compliance problems.   Diabetes He presents for his follow-up diabetic visit. Diabetes type: prediabetes. Pertinent negatives for hypoglycemia include no dizziness or headaches. Pertinent negatives for diabetes include no blurred vision, no chest pain, no polydipsia and no polyuria. Symptoms are stable. Pertinent negatives for diabetic complications include no CVA.    Lab Results  Component Value Date   NA 140 03/21/2022   K 4.8 03/21/2022   CO2 21 03/21/2022   GLUCOSE 105 (H) 03/21/2022    BUN 22 03/21/2022   CREATININE 1.68 (H) 03/21/2022   CALCIUM  9.8 03/21/2022   EGFR 49 (L) 03/21/2022   GFRNONAA 65 03/29/2020   Lab Results  Component Value Date   CHOL 201 (H) 03/21/2022   HDL 33 (L) 03/21/2022   LDLCALC 135 (H) 03/21/2022   LDLDIRECT 119 (H) 02/16/2020   TRIG 180 (H) 03/21/2022   No results found for: TSH Lab Results  Component Value Date   HGBA1C 6.0 (H) 03/21/2022   No results found for: WBC, HGB, HCT, MCV, PLT Lab Results  Component Value Date   ALT 41 03/21/2022   AST 28 03/21/2022   ALKPHOS 65 03/21/2022   BILITOT 0.8 03/21/2022   No results found for: 25OHVITD2, 25OHVITD3, VD25OH   Review of Systems  Constitutional:  Negative for chills, fever and malaise/fatigue.  HENT:  Negative for congestion.   Eyes:  Negative for blurred vision.  Respiratory:  Negative for cough, chest tightness, shortness of breath and wheezing.   Cardiovascular:  Negative for chest pain, palpitations, orthopnea and PND.  Gastrointestinal:  Negative for blood in stool.  Endocrine: Negative for polydipsia and polyuria.  Genitourinary:  Negative for decreased urine volume and hematuria.  Musculoskeletal:  Negative for myalgias and neck pain.  Neurological:  Negative for dizziness, focal weakness, numbness and headaches.  Hematological:  Negative for adenopathy. Does not bruise/bleed easily.    Patient Active Problem List   Diagnosis Date Noted   Encounter for screening colonoscopy 06/27/2022   Polyp of descending colon 06/27/2022    No Known Allergies  Past Surgical History:  Procedure Laterality Date   COLONOSCOPY WITH PROPOFOL  N/A 06/27/2022   Procedure: COLONOSCOPY WITH PROPOFOL ;  Surgeon: Jinny Carmine, MD;  Location: [facility];  Service: Endoscopy;  Laterality: N/A;   POLYPECTOMY  06/27/2022   Procedure: POLYPECTOMY;  Surgeon: Jinny Carmine, MD;  Location: [facility];  Service: Endoscopy;;   THROAT SURGERY      Social  History   Tobacco Use   Smoking status: Never   Smokeless tobacco: Never  Vaping Use   Vaping status: Never Used  Substance Use Topics   Alcohol use: Not Currently   Drug use: Never     Medication list has been reviewed and updated.  Current Meds  Medication Sig   Choline Fenofibrate  (FENOFIBRIC ACID ) 135 MG CPDR TAKE 1 CAPSULE BY MOUTH DAILY   losartan -hydrochlorothiazide (HYZAAR) 50-12.5 MG tablet Take 1 tablet by mouth daily.   omeprazole (PRILOSEC) 10 MG capsule Take 10 mg by mouth daily.       03/10/2023    1:40 PM 09/19/2022    9:02 AM 03/31/2022    4:37 PM 03/21/2022    9:32 AM  GAD 7 : Generalized Anxiety Score  Nervous, Anxious, on Edge 1 0 0 0  Control/stop worrying 0 0 0 0  Worry too much - different things 0 0 0 0  Trouble relaxing 0 0 0 0  Restless 0 0 0 0  Easily annoyed or irritable 2 0 0 0  Afraid - awful might happen 0 0 0 0  Total GAD 7 Score 3 0 0 0  Anxiety Difficulty Not difficult at all Not difficult at all Not difficult at all Not difficult at all       03/10/2023    1:39 PM 09/19/2022    9:02 AM 03/31/2022    4:37 PM  Depression screen PHQ 2/9  Decreased Interest 0 0 0  Down, Depressed, Hopeless 0 0 0  PHQ - 2 Score 0 0 0  Altered sleeping 2 0 0  Tired, decreased energy 2 0 0  Change in appetite 0 0 0  Feeling bad or failure about yourself  0 0 0  Trouble concentrating 0 0 0  Moving slowly or fidgety/restless 0 0 0  Suicidal thoughts 0 0 0  PHQ-9 Score 4 0 0  Difficult doing work/chores Not difficult at all Not difficult at all Not difficult at all    BP Readings from Last 3 Encounters:  03/10/23 128/77  09/19/22 110/64  06/27/22 100/62    Physical Exam Vitals and nursing note reviewed.  Constitutional:      Appearance: He is well-developed.  HENT:     Head: Normocephalic and atraumatic.     Right Ear: Tympanic membrane, ear canal and external ear normal.     Left Ear: Tympanic membrane, ear canal and external ear normal.      Nose: Nose normal.     Mouth/Throat:     Dentition: Normal dentition.  Eyes:     General: Lids are normal. No scleral icterus.    Conjunctiva/sclera: Conjunctivae normal.     Pupils: Pupils are equal, round, and reactive to light.  Neck:     Thyroid: No thyromegaly.     Vascular: No carotid bruit, hepatojugular reflux or JVD.     Trachea: No tracheal deviation.  Cardiovascular:     Rate and Rhythm: Normal rate and regular rhythm.     Heart sounds: Normal heart sounds. No murmur heard.    No friction rub. No gallop.  Pulmonary:     Effort: Pulmonary effort is normal.     Breath sounds: Normal breath sounds. No wheezing, rhonchi or rales.  Abdominal:     General: Bowel sounds are normal.     Palpations: Abdomen is soft. There is no hepatomegaly, splenomegaly or mass.     Tenderness: There is no abdominal tenderness.     Hernia: There is no hernia in the left inguinal area.  Genitourinary:    Testes: Normal.     Prostate: Normal.     Rectum: Normal.  Musculoskeletal:        General: Normal range of motion.     Cervical back: Normal range of motion and neck supple.  Lymphadenopathy:     Cervical: No cervical adenopathy.  Skin:    General: Skin is warm and dry.     Findings: No rash.  Neurological:     Mental Status: He is alert and oriented to person, place, and time.     Sensory: No sensory deficit.     Deep Tendon Reflexes: Reflexes are normal and symmetric.     Reflex Scores:      Patellar reflexes are 2+ on the right side and 2+ on the left side. Psychiatric:        Mood and Affect: Mood is not anxious or depressed.     Wt Readings from Last 3 Encounters:  03/10/23 234 lb (106.1 kg)  09/19/22 226 lb (102.5 kg)  06/27/22 219 lb (99.3 kg)    BP 128/77   Pulse 82   Ht 5' 9 (1.753 m)   Wt 234 lb (106.1 kg)   SpO2 100%   BMI 34.56 kg/m   Assessment and Plan:  1. Essential hypertension (Primary) Chronic.  Controlled.  Stable.  Asymptomatic.  Blood pressure  today is 128/77.  Tolerating medication well.  Continue losartan  hydrochlorothiazide 50-12.5 mg once a day.  Will check CMP for electrolytes and GFR. - losartan -hydrochlorothiazide (HYZAAR) 50-12.5 MG tablet; Take 1 tablet by mouth daily.  Dispense: 90 tablet; Refill: 1 - Comprehensive metabolic panel - Hemoglobin A1c  2. Mixed hyperlipidemia Chronic.  Controlled.  Stable.  Currently on fenofibrate 135 mg once a day.  Asymptomatic without muscle weakness or myalgias.  Will check lipid panel for current level of LDL control.  Patient will return in the morning fasting for lipid panel to be drawn by [facility]. - Choline Fenofibrate  (FENOFIBRIC ACID ) 135 MG CPDR; Take 1 capsule by mouth daily.  Dispense: 90 capsule; Refill: 1 - Lipid Panel With LDL/HDL Ratio - Comprehensive metabolic panel  3. Prediabetes Chronic.  Controlled.  Last A1c in the 6.0 range.  Last A1c was done on 03/21/2022 and we will repeat A1c in the meantime encourage patient to decrease concentrated sweets and monitor carbohydrate intake. - Hemoglobin A1c    Cathryne Molt, MD

## 2023-03-12 ENCOUNTER — Ambulatory Visit: Payer: Self-pay | Admitting: Family Medicine

## 2023-04-30 ENCOUNTER — Encounter: Payer: Self-pay | Admitting: Emergency Medicine

## 2023-04-30 ENCOUNTER — Ambulatory Visit
Admission: EM | Admit: 2023-04-30 | Discharge: 2023-04-30 | Disposition: A | Discharge status: Home / Self Care | Payer: BC Managed Care – PPO | Attending: Emergency Medicine | Admitting: Emergency Medicine

## 2023-04-30 DIAGNOSIS — J069 Acute upper respiratory infection, unspecified: Secondary | ICD-10-CM | POA: Diagnosis not present

## 2023-04-30 MED ORDER — IPRATROPIUM BROMIDE 0.06 % NA SOLN: 2.0000 | Freq: Four times a day (QID) | NASAL | 12 refills | Status: DC

## 2023-04-30 MED ORDER — AEROCHAMBER MV MISC: 2 refills | Status: DC

## 2023-04-30 MED ORDER — AMOXICILLIN-POT CLAVULANATE 875-125 MG PO TABS: 1.0000 | ORAL_TABLET | Freq: Two times a day (BID) | ORAL | 0 refills | Status: AC

## 2023-04-30 MED ORDER — BENZONATATE 100 MG PO CAPS: 200.0000 mg | ORAL_CAPSULE | Freq: Three times a day (TID) | ORAL | 0 refills | Status: DC

## 2023-04-30 MED ORDER — PROMETHAZINE-DM 6.25-15 MG/5ML PO SYRP: 5.0000 mL | ORAL_SOLUTION | Freq: Four times a day (QID) | ORAL | 0 refills | Status: DC | PRN

## 2023-04-30 MED ORDER — ALBUTEROL SULFATE HFA 108 (90 BASE) MCG/ACT IN AERS: 2.0000 | INHALATION_SPRAY | RESPIRATORY_TRACT | 0 refills | Status: DC | PRN

## 2023-04-30 NOTE — ED Triage Notes (Signed)
 Pt presents with a cough, SOB, hoarse and congestion x 5 days

## 2023-04-30 NOTE — Discharge Instructions (Addendum)
 Take the Augmentin 875 mg twice daily with food for 7 days for treatment of your upper respiratory faction.  Use the albuterol inhaler with the spacer, 1 to 2 puffs every 4-6 hours, as needed for any shortness breath or wheezing.  Use the Atrovent nasal spray, 2 squirts in each nostril every 6 hours, as needed for runny nose and postnasal drip.  Use the Tessalon Perles every 8 hours during the day.  Take them with a small sip of water.  They may give you some numbness to the base of your tongue or a metallic taste in your mouth, this is normal.  Use the Promethazine DM cough syrup at bedtime for cough and congestion.  It will make you drowsy so do not take it during the day.  Return for reevaluation or see your primary care provider for any new or worsening symptoms.

## 2023-04-30 NOTE — ED Provider Notes (Signed)
 [facility]    CSN: [id number] Arrival date & time: 04/30/23  1421      History   Chief Complaint Chief Complaint  Patient presents with   Cough   Shortness of Breath   Nasal Congestion    HPI Isaac [name] is a 53 y.o. male.   HPI  53 year old male with past medical history significant for hypertension and GERD presents for evaluation of respiratory symptoms that began 5 days ago.  Use include runny nose, nasal congestion, postnasal drip, cough, short of breath, and wheezing.  He reports that the cough is intermittently productive and worse when he lays down.  He denies any fever.  Past Medical History:  Diagnosis Date   GERD (gastroesophageal reflux disease)    Hypertension     Patient Active Problem List   Diagnosis Date Noted   Encounter for screening colonoscopy 06/27/2022   Polyp of descending colon 06/27/2022    Past Surgical History:  Procedure Laterality Date   COLONOSCOPY WITH PROPOFOL N/A 06/27/2022   Procedure: COLONOSCOPY WITH PROPOFOL;  Surgeon: Midge Minium, MD;  Location: [facility];  Service: Endoscopy;  Laterality: N/A;   POLYPECTOMY  06/27/2022   Procedure: POLYPECTOMY;  Surgeon: Midge Minium, MD;  Location: [facility];  Service: Endoscopy;;   THROAT SURGERY         Home Medications    Prior to Admission medications   Medication Sig Start Date End Date Taking? Authorizing Provider  albuterol (VENTOLIN HFA) 108 (90 Base) MCG/ACT inhaler Inhale 2 puffs into the lungs every 4 (four) hours as needed. 04/30/23  Yes Becky Augusta, NP  amoxicillin-clavulanate (AUGMENTIN) 875-125 MG tablet Take 1 tablet by mouth every 12 (twelve) hours for 7 days. 04/30/23 05/07/23 Yes Becky Augusta, NP  benzonatate (TESSALON) 100 MG capsule Take 2 capsules (200 mg total) by mouth every 8 (eight) hours. 04/30/23  Yes Becky Augusta, NP  Choline Fenofibrate (FENOFIBRIC ACID) 135 MG CPDR Take 1 capsule by mouth daily. 03/10/23  Yes Duanne Limerick, MD  ipratropium (ATROVENT) 0.06 % nasal spray Place 2 sprays into both nostrils 4 (four) times daily. 04/30/23  Yes Becky Augusta, NP  losartan-hydrochlorothiazide (HYZAAR) 50-12.5 MG tablet Take 1 tablet by mouth daily. 03/10/23  Yes Duanne Limerick, MD  promethazine-dextromethorphan (PROMETHAZINE-DM) 6.25-15 MG/5ML syrup Take 5 mLs by mouth 4 (four) times daily as needed. 04/30/23  Yes Becky Augusta, NP  Spacer/Aero-Holding Chambers (AEROCHAMBER MV) inhaler Use as instructed 04/30/23  Yes Becky Augusta, NP  omeprazole (PRILOSEC) 10 MG capsule Take 10 mg by mouth daily.    [provider]    Family History Family History  Problem Relation Age of Onset   Stroke Mother    Cancer Mother    Hypertension Mother    Hypertension Father    Stroke Father    Diabetes Father    Heart disease Father    Hypertension Sister     Social History Social History   Tobacco Use   Smoking status: Never   Smokeless tobacco: Never  Vaping Use   Vaping status: Never Used  Substance Use Topics   Alcohol use: Not Currently   Drug use: Never     Allergies   Patient has no known allergies.   Review of Systems Review of Systems  Constitutional:  Negative for fever.  HENT:  Positive for congestion and rhinorrhea.   Respiratory:  Positive for cough, shortness of breath and wheezing.      Physical Exam  Triage Vital Signs ED Triage Vitals  Encounter Vitals Group     BP 04/30/23 1430 (!) 140/102     Systolic BP Percentile --      Diastolic BP Percentile --      Pulse Rate 04/30/23 1430 (!) 103     Resp 04/30/23 1430 18     Temp 04/30/23 1430 98.6 F (37 C)     Temp Source 04/30/23 1430 Oral     SpO2 04/30/23 1430 96 %     Weight --      Height --      Head Circumference --      Peak Flow --      Pain Score 04/30/23 1429 0     Pain Loc --      Pain Education --      Exclude from Growth Chart --    No data found.  Updated Vital Signs BP (!) 140/102 (BP Location: Left Arm)    Pulse (!) 103   Temp 98.6 F (37 C) (Oral)   Resp 18   SpO2 96%   Visual Acuity Right Eye Distance:   Left Eye Distance:   Bilateral Distance:    Right Eye Near:   Left Eye Near:    Bilateral Near:     Physical Exam Vitals and nursing note reviewed.  Constitutional:      Appearance: Normal appearance. He is not ill-appearing.  HENT:     Head: Normocephalic and atraumatic.     Right Ear: Tympanic membrane, ear canal and external ear normal. There is no impacted cerumen.     Left Ear: Tympanic membrane, ear canal and external ear normal. There is no impacted cerumen.     Nose: Congestion and rhinorrhea present.     Comments: Nasal mucosa is edematous and erythematous with clear discharge in both nares.    Mouth/Throat:     Mouth: Mucous membranes are moist.     Pharynx: Oropharynx is clear. Posterior oropharyngeal erythema present. No oropharyngeal exudate.     Comments: Erythema to the posterior pharynx with clear postnasal drip. Cardiovascular:     Rate and Rhythm: Normal rate and regular rhythm.     Pulses: Normal pulses.     Heart sounds: Normal heart sounds. No murmur heard.    No friction rub. No gallop.  Pulmonary:     Effort: Pulmonary effort is normal.     Breath sounds: Wheezing present. No rhonchi or rales.  Musculoskeletal:     Cervical back: Normal range of motion and neck supple. No tenderness.  Lymphadenopathy:     Cervical: No cervical adenopathy.  Skin:    General: Skin is warm and dry.     Capillary Refill: Capillary refill takes less than 2 seconds.     Findings: No erythema or rash.  Neurological:     General: No focal deficit present.     Mental Status: He is alert and oriented to person, place, and time.      UC Treatments / Results  Labs (all labs ordered are listed, but only abnormal results are displayed) Labs Reviewed - No data to display  EKG   Radiology No results found.  Procedures Procedures (including critical care  time)  Medications Ordered in UC Medications - No data to display  Initial Impression / Assessment and Plan / UC Course  I have reviewed the triage vital signs and the nursing notes.  Pertinent labs & imaging results that were available during my care  of the patient were reviewed by me and considered in my medical decision making (see chart for details).   Patient is a nontoxic-appearing 53 year old male presenting for evaluation of respiratory symptoms outlined HPI above.  He does have a hoarse voice in the exam room and talking for long peers of time or taking a deep breath does trigger a cough.  His physical exam does reveal inflammation of his upper respiratory tract with clear rhinorrhea as well as clear postnasal drip.  Cardiopulmonary exam reveals scattered wheezes.  Patient reports that he and his wife play cards regularly on Friday nights and several people at the last card game were ill.  His symptoms began the day after.  He has been using over-the-counter cough medication.  His blood pressure is elevated at 140/102, and he did take his blood pressure medication this morning.  He is also been using over-the-counter cold medication which most likely attributes to his elevated blood pressure.  Given that he has had symptoms for a week I feel a trial of antibiotics is warranted.  I will discharge him home on Augmentin 875 mg twice daily with food for 7 days.  Alost prescribe an albuterol inhaler and spacer and he can use 1 to 2 puffs every 4-6 hours and if any shortness of breath or wheezing.  Atrovent nasal spray for his nasal congestion.  Tessalon Perles and Promethazine DM cough syrup for cough and congestion.  Return precautions reviewed.   Final Clinical Impressions(s) / UC Diagnoses   Final diagnoses:  URI with cough and congestion     Discharge Instructions      Take the Augmentin 875 mg twice daily with food for 7 days for treatment of your upper respiratory faction.  Use the  albuterol inhaler with the spacer, 1 to 2 puffs every 4-6 hours, as needed for any shortness breath or wheezing.  Use the Atrovent nasal spray, 2 squirts in each nostril every 6 hours, as needed for runny nose and postnasal drip.  Use the Tessalon Perles every 8 hours during the day.  Take them with a small sip of water.  They may give you some numbness to the base of your tongue or a metallic taste in your mouth, this is normal.  Use the Promethazine DM cough syrup at bedtime for cough and congestion.  It will make you drowsy so do not take it during the day.  Return for reevaluation or see your primary care provider for any new or worsening symptoms.      ED Prescriptions     Medication Sig Dispense Auth. Provider   Spacer/Aero-Holding Chambers (AEROCHAMBER MV) inhaler Use as instructed 1 each Becky Augusta, NP   albuterol (VENTOLIN HFA) 108 (90 Base) MCG/ACT inhaler Inhale 2 puffs into the lungs every 4 (four) hours as needed. 18 g Becky Augusta, NP   amoxicillin-clavulanate (AUGMENTIN) 875-125 MG tablet Take 1 tablet by mouth every 12 (twelve) hours for 7 days. 14 tablet Becky Augusta, NP   benzonatate (TESSALON) 100 MG capsule Take 2 capsules (200 mg total) by mouth every 8 (eight) hours. 21 capsule Becky Augusta, NP   ipratropium (ATROVENT) 0.06 % nasal spray Place 2 sprays into both nostrils 4 (four) times daily. 15 mL Becky Augusta, NP   promethazine-dextromethorphan (PROMETHAZINE-DM) 6.25-15 MG/5ML syrup Take 5 mLs by mouth 4 (four) times daily as needed. 118 mL Becky Augusta, NP      PDMP not reviewed this encounter.   Becky Augusta, NP 04/30/23 1452

## 2023-07-20 ENCOUNTER — Other Ambulatory Visit: Payer: Self-pay | Admitting: Family Medicine

## 2023-07-20 ENCOUNTER — Telehealth: Payer: Self-pay | Admitting: Family Medicine

## 2023-07-20 DIAGNOSIS — I10 Essential (primary) hypertension: Secondary | ICD-10-CM

## 2023-07-20 NOTE — Telephone Encounter (Signed)
 Copied from CRM [id number]. Topic: Clinical - Medication Refill >> Jul 20, 2023  2:06 PM Rachelle R wrote: Medication: losartan -hydrochlorothiazide (HYZAAR) 50-12.5 MG tablet  Has the patient contacted their pharmacy? Yes (Agent: If no, request that the patient contact the pharmacy for the refill. If patient does not wish to contact the pharmacy document the reason why and proceed with request.) (Agent: If yes, when and what did the pharmacy advise?)  This is the patient's preferred pharmacy:  [id number],  - [address] AT [facility] [address] [place] [address] [place] [address] Phone: [phone] Fax: [phone]  Is this the correct pharmacy for this prescription? Yes If no, delete pharmacy and type the correct one.   Has the prescription been filled recently? No  Is the patient out of the medication? Yes  Has the patient been seen for an appointment in the last year OR does the patient have an upcoming appointment? Yes  Can we respond through MyChart? Yes  Agent: Please be advised that Rx refills may take up to 3 business days. We ask that you follow-up with your pharmacy.

## 2023-07-20 NOTE — Telephone Encounter (Signed)
 Patient needs refills on til his appointment in July losartan -hydrochlorothiazide (HYZAAR) 50-12.5 MG tablet Choline Fenofibrate  (FENOFIBRIC ACID ) 135 MG CPDR   Sent to [facility] [place]

## 2023-07-20 NOTE — Telephone Encounter (Signed)
 Please review and send RX if appropriate.  JM

## 2023-07-21 ENCOUNTER — Ambulatory Visit: Payer: Self-pay | Admitting: Family Medicine

## 2023-07-21 MED ORDER — LOSARTAN POTASSIUM-HCTZ 50-12.5 MG PO TABS
1.0000 | ORAL_TABLET | Freq: Every day | ORAL | 1 refills | Status: DC
Start: 2023-07-21 — End: 2023-07-23

## 2023-07-23 ENCOUNTER — Other Ambulatory Visit: Payer: Self-pay

## 2023-07-23 DIAGNOSIS — I10 Essential (primary) hypertension: Secondary | ICD-10-CM

## 2023-07-23 DIAGNOSIS — E782 Mixed hyperlipidemia: Secondary | ICD-10-CM

## 2023-07-23 MED ORDER — LOSARTAN POTASSIUM-HCTZ 50-12.5 MG PO TABS: 1.0000 | ORAL_TABLET | Freq: Every day | ORAL | 1 refills | Status: DC

## 2023-07-23 MED ORDER — FENOFIBRIC ACID 135 MG PO CPDR: 1.0000 | DELAYED_RELEASE_CAPSULE | Freq: Every day | ORAL | 1 refills | Status: DC

## 2023-07-23 NOTE — Telephone Encounter (Signed)
 Completed. JM

## 2023-09-02 ENCOUNTER — Ambulatory Visit (INDEPENDENT_AMBULATORY_CARE_PROVIDER_SITE_OTHER): Admitting: Family Medicine

## 2023-09-02 ENCOUNTER — Encounter: Payer: Self-pay | Admitting: Family Medicine

## 2023-09-02 VITALS — BP 122/90 | HR 72 | Ht 69.0 in | Wt 231.0 lb

## 2023-09-02 DIAGNOSIS — I10 Essential (primary) hypertension: Secondary | ICD-10-CM | POA: Diagnosis not present

## 2023-09-02 DIAGNOSIS — R3129 Other microscopic hematuria: Secondary | ICD-10-CM | POA: Diagnosis not present

## 2023-09-02 DIAGNOSIS — E782 Mixed hyperlipidemia: Secondary | ICD-10-CM | POA: Diagnosis not present

## 2023-09-02 DIAGNOSIS — Z8249 Family history of ischemic heart disease and other diseases of the circulatory system: Secondary | ICD-10-CM

## 2023-09-02 DIAGNOSIS — Z87442 Personal history of urinary calculi: Secondary | ICD-10-CM

## 2023-09-02 DIAGNOSIS — R7303 Prediabetes: Secondary | ICD-10-CM

## 2023-09-02 DIAGNOSIS — Z6834 Body mass index (BMI) 34.0-34.9, adult: Secondary | ICD-10-CM

## 2023-09-02 DIAGNOSIS — Z125 Encounter for screening for malignant neoplasm of prostate: Secondary | ICD-10-CM

## 2023-09-02 DIAGNOSIS — R499 Unspecified voice and resonance disorder: Secondary | ICD-10-CM

## 2023-09-02 NOTE — Progress Notes (Signed)
 Established Patient Office Visit  Subjective   Patient ID: HYDER DEMAN, male    DOB: May 06, 1970  Age: 53 y.o. MRN: [id number]  Chief Complaint  Patient presents with   Transitions Of Care    Med refills      Assessment & Plan:   Problem List Items Addressed This Visit   None Visit Diagnoses       Mixed hyperlipidemia    -  Primary   Relevant Orders   Lipid panel     Essential hypertension       Relevant Orders   Comprehensive metabolic panel with GFR     Prediabetes       Relevant Orders   Hemoglobin A1c     Other microscopic hematuria       Relevant Orders   CBC with Differential/Platelet     History of nephrolithiasis       Relevant Orders   Urinalysis     Screening for prostate cancer       Relevant Orders   PSA, total and free     BMI 34.0-34.9,adult       Relevant Orders   TSH     Family history of cardiac disorder in father       Relevant Orders   Lipoprotein A (LPA)     Change of voice       Relevant Orders   Ambulatory referral to ENT     Routine lab work ordered.  Checking LPA due to family history. Recommended patient to restart his medications. Continue DASH diet and exercise. ENT referral placed No follow-ups on file.   53 year old male with past medical history of hyperlipidemia, hypertension,  prediabetes presents to the clinic for chronic medication management.  Reports that he has been not taking medication for the last 3 months, states he had a disagreement with his previous PCP last summer.  He ran out of the medication. he did get a refill but did not start taking medication.  Patient reports that he has a family hx of stroke in mother at age 75 and father at age 82.  Significant cardiac history: Father had MI at age 47  Patient is doing good overall.  Patient is agreeing for lab work.  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    Self employed, works [employer].   He leads active life style through work, stated playing pickle bowel.       Review of Systems  All other systems reviewed and are negative.     Objective:     BP (!) 122/90   Pulse 72   Ht 5' 9 (1.753 m)   Wt 231 lb (104.8 kg)   SpO2 98%   BMI 34.11 kg/m    Physical Exam Vitals and nursing note reviewed.  Constitutional:      Appearance: Normal appearance.  HENT:     Head: Normocephalic.     Right Ear: External ear normal.     Left Ear: External ear normal.  Eyes:     Conjunctiva/sclera: Conjunctivae normal.  Cardiovascular:     Rate and Rhythm: Normal rate.  Pulmonary:     Effort: No respiratory distress.  Abdominal:     Palpations: Abdomen is soft.  Musculoskeletal:        General: Normal range of motion.  Skin:  General: Skin is warm.  Neurological:     Mental Status: He is alert and oriented to person, place, and time.  Psychiatric:        Mood and Affect: Mood normal.      No results found for any visits on 09/02/23.    The 10-year ASCVD risk score (Arnett DK, et al., 2019) is: 7.4%      Vinary K Montague Corella, MD

## 2023-09-03 ENCOUNTER — Ambulatory Visit: Payer: Self-pay | Admitting: Family Medicine

## 2023-09-03 LAB — TSH: TSH: 1.2 u[IU]/mL (ref 0.450–4.500)

## 2023-09-03 LAB — LIPID PANEL
Chol/HDL Ratio: 6.3 ratio — ABNORMAL HIGH (ref 0.0–5.0)
Cholesterol, Total: 184 mg/dL (ref 100–199)
HDL: 29 mg/dL — ABNORMAL LOW (ref >39)
LDL Chol Calc (NIH): 119 mg/dL — ABNORMAL HIGH (ref 0–99)
Triglycerides: 204 mg/dL — ABNORMAL HIGH (ref 0–149)
VLDL Cholesterol Cal: 36 mg/dL (ref 5–40)

## 2023-09-03 LAB — COMPREHENSIVE METABOLIC PANEL WITH GFR
ALT: 25 IU/L (ref 0–44)
AST: 21 IU/L (ref 0–40)
Albumin: 4.6 g/dL (ref 3.8–4.9)
Alkaline Phosphatase: 96 IU/L (ref 44–121)
BUN/Creatinine Ratio: 14 (ref 9–20)
BUN: 18 mg/dL (ref 6–24)
Bilirubin Total: 0.8 mg/dL (ref 0.0–1.2)
CO2: 20 mmol/L (ref 20–29)
Calcium: 10 mg/dL (ref 8.7–10.2)
Chloride: 102 mmol/L (ref 96–106)
Creatinine, Ser: 1.31 mg/dL — ABNORMAL HIGH (ref 0.76–1.27)
Globulin, Total: 2.7 g/dL (ref 1.5–4.5)
Glucose: 121 mg/dL — ABNORMAL HIGH (ref 70–99)
Potassium: 4.3 mmol/L (ref 3.5–5.2)
Sodium: 139 mmol/L (ref 134–144)
Total Protein: 7.3 g/dL (ref 6.0–8.5)
eGFR: 65 mL/min/{1.73_m2} (ref >59)

## 2023-09-03 LAB — LIPOPROTEIN A (LPA): Lipoprotein (a): 9.1 nmol/L (ref <75.0)

## 2023-09-03 LAB — CBC WITH DIFFERENTIAL/PLATELET
Basophils Absolute: 0.1 10*3/uL (ref 0.0–0.2)
Basos: 1 %
EOS (ABSOLUTE): 0.1 10*3/uL (ref 0.0–0.4)
Eos: 2 %
Hematocrit: 56.8 % — ABNORMAL HIGH (ref 37.5–51.0)
Hemoglobin: 19.4 g/dL — ABNORMAL HIGH (ref 13.0–17.7)
Immature Grans (Abs): 0 10*3/uL (ref 0.0–0.1)
Immature Granulocytes: 0 %
Lymphocytes Absolute: 2.3 10*3/uL (ref 0.7–3.1)
Lymphs: 39 %
MCH: 30 pg (ref 26.6–33.0)
MCHC: 34.2 g/dL (ref 31.5–35.7)
MCV: 88 fL (ref 79–97)
Monocytes Absolute: 0.4 10*3/uL (ref 0.1–0.9)
Monocytes: 7 %
Neutrophils Absolute: 3 10*3/uL (ref 1.4–7.0)
Neutrophils: 51 %
Platelets: 231 10*3/uL (ref 150–450)
RBC: 6.47 x10E6/uL — ABNORMAL HIGH (ref 4.14–5.80)
RDW: 12.8 % (ref 11.6–15.4)
WBC: 5.9 10*3/uL (ref 3.4–10.8)

## 2023-09-03 LAB — URINALYSIS
Bilirubin, UA: NEGATIVE
Glucose, UA: NEGATIVE
Ketones, UA: NEGATIVE
Leukocytes,UA: NEGATIVE
Nitrite, UA: NEGATIVE
Protein,UA: NEGATIVE
Specific Gravity, UA: 1.018 (ref 1.005–1.030)
Urobilinogen, Ur: 0.2 mg/dL (ref 0.2–1.0)
pH, UA: 6 (ref 5.0–7.5)

## 2023-09-03 LAB — HEMOGLOBIN A1C
Est. average glucose Bld gHb Est-mCnc: 128 mg/dL
Hgb A1c MFr Bld: 6.1 % — ABNORMAL HIGH (ref 4.8–5.6)

## 2023-09-03 LAB — PSA, TOTAL AND FREE
PSA, Free Pct: 42 %
PSA, Free: 0.21 ng/mL
Prostate Specific Ag, Serum: 0.5 ng/mL (ref 0.0–4.0)

## 2023-12-11 ENCOUNTER — Ambulatory Visit
Admission: EM | Admit: 2023-12-11 | Discharge: 2023-12-11 | Disposition: A | Discharge status: Home / Self Care | Attending: Physician Assistant | Admitting: Physician Assistant

## 2023-12-11 ENCOUNTER — Ambulatory Visit (INDEPENDENT_AMBULATORY_CARE_PROVIDER_SITE_OTHER)

## 2023-12-11 DIAGNOSIS — R0789 Other chest pain: Secondary | ICD-10-CM

## 2023-12-11 DIAGNOSIS — N200 Calculus of kidney: Secondary | ICD-10-CM

## 2023-12-11 NOTE — Discharge Instructions (Addendum)
-   You have a 10 mm kidney stone in your kidney.  This could be causing your pain. - Increase your fluid intake and rest. - I put a referral into urology for you so they should be calling you sometime in the next week.  If you have not heard from them within a week give them a call. - Take naproxen  as needed for pain relief.  Can also take Tylenol. - If at any point you develop fever, painful urination, blood in urine, weakness, abdominal pain, vomiting, chest pain or breathing difficulty please go to the ER.

## 2023-12-11 NOTE — ED Triage Notes (Signed)
 Pt c/o right side flank pain x1week  Pt states that the pain comes and goes  Pt is unsure if he cracked a rib and states the pain is along the rib cage.   

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
## 2023-12-15 ENCOUNTER — Ambulatory Visit: Payer: Self-pay | Admitting: Urology

## 2023-12-15 ENCOUNTER — Other Ambulatory Visit: Payer: Self-pay

## 2023-12-15 ENCOUNTER — Ambulatory Visit (INDEPENDENT_AMBULATORY_CARE_PROVIDER_SITE_OTHER): Admitting: Urology

## 2023-12-15 ENCOUNTER — Other Ambulatory Visit
Admission: RE | Admit: 2023-12-15 | Discharge: 2023-12-15 | Disposition: A | Discharge status: Home / Self Care | Attending: Urology | Admitting: Urology

## 2023-12-15 VITALS — BP 168/110 | HR 72 | Wt 232.0 lb

## 2023-12-15 DIAGNOSIS — N200 Calculus of kidney: Secondary | ICD-10-CM | POA: Insufficient documentation

## 2023-12-15 DIAGNOSIS — R1011 Right upper quadrant pain: Secondary | ICD-10-CM

## 2023-12-15 DIAGNOSIS — Z125 Encounter for screening for malignant neoplasm of prostate: Secondary | ICD-10-CM | POA: Diagnosis not present

## 2023-12-15 LAB — URINALYSIS, COMPLETE (UACMP) WITH MICROSCOPIC
Bilirubin Urine: NEGATIVE
Glucose, UA: NEGATIVE mg/dL
Hgb urine dipstick: NEGATIVE
Ketones, ur: NEGATIVE mg/dL
Leukocytes,Ua: NEGATIVE
Nitrite: NEGATIVE
Protein, ur: NEGATIVE mg/dL
Specific Gravity, Urine: 1.02 (ref 1.005–1.030)
pH: 7 (ref 5.0–8.0)

## 2023-12-15 NOTE — Patient Instructions (Addendum)
 Please Call to schedule CT scan [phone]  ESWL for Kidney Stones  Extracorporeal shock wave lithotripsy (ESWL) is a treatment that can help break up kidney stones that are too large to pass on their own.  This is a nonsurgical procedure that breaks up a kidney stone with shock waves. These shock waves pass through your body and focus on the kidney stone. They cause the kidney stone to break into smaller pieces (fragments) while it is still in the urinary tract. The fragments of stone can pass more easily out of your body in the pee (urine). Tell a health care provider about: Any allergies you have. All medicines you are taking, including vitamins, herbs, eye drops, creams, and over-the-counter medicines. Any problems you or family members have had with anesthetic medicines. Any bleeding problems you have. Any surgeries you've had. Any medical conditions you have. Whether you're pregnant or may be pregnant. What are the risks? Your health care provider will talk with you about risks. These may include: Infection. Bleeding from the kidney. Bruising of the kidney or skin. Scarring of the kidney. This can lead to: Increased blood pressure. Poor kidney function. Return (recurrence) of kidney stones. Damage to other structures or organs. This may include the liver, colon, spleen, or pancreas. Blockage (obstruction) of the tube that carries pee from the kidney to the bladder (ureter). Failure of the kidney stone to break into fragments. What happens before the procedure? When to stop eating and drinking Follow instructions from your health care provider about what you may eat and drink. These may include: 8 hours before your procedure Stop eating most foods. Do not eat meat, fried foods, or fatty foods. Eat only light foods, such as toast or crackers. All liquids are okay except energy drinks and alcohol. 6 hours before your procedure Stop eating. Drink only clear liquids, such as  water , clear fruit juice, black coffee, plain tea, and sports drinks. Do not drink energy drinks or alcohol. 2 hours before your procedure Stop drinking all liquids. You may be allowed to take medicines with small sips of water . If you do not follow your health care provider's instructions, your procedure may be delayed or canceled. Medicines Ask your health care provider about: Changing or stopping your regular medicines. These include any diabetes medicines or blood thinners you take. Taking medicines such as aspirin and ibuprofen. These medicines can thin your blood. Do not take them unless your health care provider tells you to. Taking over-the-counter medicines, vitamins, herbs, and supplements. Tests You may have tests, such as: Blood tests. Pee (urine) tests. Imaging tests. This may include a CT scan. Surgery safety Ask your health care provider: How your surgery site will be marked. What steps will be taken to help prevent infection. These steps may include: Washing skin with a soap that kills germs. Receiving antibiotics. General instructions If you will be going home right after the procedure, plan to have a responsible adult: Take you home from the hospital or clinic. You will not be allowed to drive. Care for you for the time you are told. What happens during the procedure?  An IV will be inserted into one of your veins. You may be given: A sedative. This helps you relax. Anesthesia. This will: Numb certain areas of your body. Make you fall asleep for surgery. A water -filled cushion may be placed behind your kidney or on your abdomen. In some cases, you may be placed in a tub of lukewarm water . Your body will  be positioned in a way that makes it easier to target the kidney stone. An X-ray or ultrasound exam will be done to locate your stone. Shock waves will be aimed at the stone. If you are awake, you may feel a tapping sensation as the shock waves pass through your  body. A small mesh tube (stent) may be placed in your ureter. This will help keep pee flowing from the kidney if the fragments of the stone have been blocking the ureter. The stent will be removed at a later time by your health care provider. The procedure may vary among health care providers and hospitals. What happens after the procedure? Your blood pressure, heart rate, breathing rate, and blood oxygen level will be monitored until you leave the hospital or clinic. You may have an X-ray after the procedure to see how many of the kidney stones were broken up. This will also show how much of the stone has passed. If there are still large fragments after treatment, you may need to have a second procedure at a later time. This information is not intended to replace advice given to you by your health care provider. Make sure you discuss any questions you have with your health care provider. Document Revised: 08/30/2022 Document Reviewed: 06/20/2021 Elsevier Patient Education  2024 [employer].

## 2023-12-15 NOTE — Progress Notes (Signed)
 12/15/23 3:33 PM   Dorn ONEIDA Gilding [birth year] [id number]  CC: Back pain, kidney stone, PSA screening  HPI: 53 year old male with distant history of kidney stones who presented to urgent care with right upper flank pain after a day of bending and lifting.  It seems to be worse with movement.  He had a chest x-ray performed that showed a 1 cm right upper pole stone.  He denies any dysuria or gross hematuria.  Urinalysis today is pending.  PSA July 2025 normal at 0.5.  PMH: Past Medical History:  Diagnosis Date   GERD (gastroesophageal reflux disease)    Hypertension     Surgical History: Past Surgical History:  Procedure Laterality Date   COLONOSCOPY WITH PROPOFOL  N/A 06/27/2022   Procedure: COLONOSCOPY WITH PROPOFOL ;  Surgeon: Jinny Carmine, MD;  Location: [facility];  Service: Endoscopy;  Laterality: N/A;   POLYPECTOMY  06/27/2022   Procedure: POLYPECTOMY;  Surgeon: Jinny Carmine, MD;  Location: [facility];  Service: Endoscopy;;   THROAT SURGERY       Family History: Family History  Problem Relation Age of Onset   Stroke Mother    Cancer Mother    Hypertension Mother    Hypertension Father    Stroke Father    Diabetes Father    Heart disease Father    Hypertension Sister     Social History:  reports that he has never smoked. He has never used smokeless tobacco. He reports current alcohol use. He reports that he does not use drugs.  Physical Exam: BP (!) 168/110 (BP Location: Left Arm, Patient Position: Sitting, Cuff Size: Large)   Pulse 72   Wt 232 lb (105.2 kg)   SpO2 98%   BMI 34.26 kg/m    Constitutional:  Alert and oriented, No acute distress. Cardiovascular: No clubbing, cyanosis, or edema. Respiratory: Normal respiratory effort, no increased work of breathing. GI: Abdomen is soft, nontender, nondistended, no abdominal masses   Laboratory Data: Urinalysis today pending  Pertinent Imaging: I have personally viewed and  interpreted the chest x-ray showing a 1 cm right upper pole stone.  Assessment & Plan:   53 year old male with right sided upper flank pain and back pain, worse with movement, and a chest x-ray showing a 1 cm right upper pole stone.  His clinical history more consistent with MSK etiology, however will obtain CT for further evaluation.  He is also interested in definitive treatment based on the stone being too large to pass, prefers shockwave lithotripsy.  PSA normal at 0.5.  We discussed various treatment options for urolithiasis including observation with or without medical expulsive therapy, shockwave lithotripsy (SWL), ureteroscopy and laser lithotripsy with stent placement, and percutaneous nephrolithotomy.We discussed that management is based on stone size, location, density, patient co-morbidities, and patient preference. Stones <31mm in size have a >80% spontaneous passage rate. Data surrounding the use of tamsulosin for medical expulsive therapy is controversial, but meta analyses suggests it is most efficacious for distal stones between 5-43mm in size. Possible side effects include dizziness/lightheadedness, and retrograde ejaculation.SWL has a lower stone free rate in a single procedure, but also a lower complication rate compared to ureteroscopy and avoids a stent and associated stent related symptoms. Possible complications include renal hematoma, steinstrasse, and need for additional treatment.Ureteroscopy with laser lithotripsy and stent placement has a higher stone free rate than SWL in a single procedure, however increased complication rate including possible infection, ureteral injury, bleeding, and stent related morbidity. Common stent related symptoms  include dysuria, urgency/frequency, and flank pain.  CT for further evaluation of right flank pain and likely nonobstructing right renal stone, patient desires shockwave lithotripsy regardless based on stone size too large to pass  Redell Burnet, MD 12/15/2023  [facility] [address] [place], [place] [address] [phone]

## 2023-12-16 ENCOUNTER — Ambulatory Visit: Payer: Self-pay

## 2023-12-21 ENCOUNTER — Telehealth: Payer: Self-pay

## 2023-12-21 NOTE — Telephone Encounter (Signed)
 Incoming call on triage line from pt inquiring if there have been any updates on his authorization for CT scan. Advised pt I did not see any updates in chart and would f/u w/ appropriate party.

## 2023-12-28 NOTE — Telephone Encounter (Signed)
 Pt aware CT approved.   See media tab.  Advised pt to call centralized scheduling. Pt voiced understanding.

## 2024-01-06 ENCOUNTER — Ambulatory Visit
Admission: RE | Admit: 2024-01-06 | Discharge: 2024-01-06 | Disposition: A | Discharge status: Home / Self Care | Source: Ambulatory Visit | Attending: Urology | Admitting: Urology

## 2024-01-06 DIAGNOSIS — R1011 Right upper quadrant pain: Secondary | ICD-10-CM | POA: Insufficient documentation

## 2024-01-12 ENCOUNTER — Ambulatory Visit: Payer: Self-pay | Admitting: Urology

## 2024-01-12 DIAGNOSIS — N200 Calculus of kidney: Secondary | ICD-10-CM

## 2024-01-12 DIAGNOSIS — N132 Hydronephrosis with renal and ureteral calculous obstruction: Secondary | ICD-10-CM

## 2024-01-12 NOTE — Telephone Encounter (Signed)
 Appt scheduled. Scan ordered.

## 2024-01-21 ENCOUNTER — Encounter

## 2024-01-26 ENCOUNTER — Encounter

## 2024-02-02 ENCOUNTER — Ambulatory Visit: Admitting: Urology

## 2024-02-03 ENCOUNTER — Encounter
Admission: RE | Admit: 2024-02-03 | Discharge: 2024-02-03 | Disposition: A | Source: Ambulatory Visit | Attending: Urology

## 2024-02-03 DIAGNOSIS — N132 Hydronephrosis with renal and ureteral calculous obstruction: Secondary | ICD-10-CM | POA: Insufficient documentation

## 2024-02-03 DIAGNOSIS — N200 Calculus of kidney: Secondary | ICD-10-CM | POA: Diagnosis present

## 2024-02-03 MED ORDER — TECHNETIUM TC 99M MERTIATIDE
5.0000 | Freq: Once | INTRAVENOUS | Status: AC | PRN
  Administered 2024-02-03: 5.2 via INTRAVENOUS

## 2024-02-03 MED ORDER — FUROSEMIDE 10 MG/ML IJ SOLN
40.0000 mg | Freq: Once | INTRAMUSCULAR | Status: DC
  Filled 2024-02-03 (×2): qty 4

## 2024-02-09 ENCOUNTER — Ambulatory Visit: Admitting: Urology

## 2024-02-10 ENCOUNTER — Telehealth: Payer: Self-pay

## 2024-02-10 ENCOUNTER — Ambulatory Visit: Admitting: Urology

## 2024-02-10 ENCOUNTER — Other Ambulatory Visit: Payer: Self-pay

## 2024-02-10 VITALS — BP 160/106

## 2024-02-10 DIAGNOSIS — N200 Calculus of kidney: Secondary | ICD-10-CM

## 2024-02-10 DIAGNOSIS — N201 Calculus of ureter: Secondary | ICD-10-CM

## 2024-02-10 LAB — URINALYSIS, COMPLETE
Bilirubin, UA: NEGATIVE
Glucose, UA: NEGATIVE
Ketones, UA: NEGATIVE
Leukocytes,UA: NEGATIVE
Nitrite, UA: NEGATIVE
Protein,UA: NEGATIVE
RBC, UA: NEGATIVE
Specific Gravity, UA: 1.02 (ref 1.005–1.030)
Urobilinogen, Ur: 0.2 mg/dL (ref 0.2–1.0)
pH, UA: 6 (ref 5.0–7.5)

## 2024-02-10 LAB — MICROSCOPIC EXAMINATION: Bacteria, UA: NONE SEEN

## 2024-02-10 NOTE — Progress Notes (Signed)
° °  02/10/2024 8:27 AM   Dorn ONEIDA Gilding 01-11-1971 [id number]  Reason for visit: Follow up nephrolithiasis, renal scan results, PSA screening  History: Initial visit October 2025 when he was having right sided back pain after some physical activity, chest x-ray reportedly showed a possible right sided upper pole stone CT November 2025 showed a 1.8 cm left mid ureteral stone with severe left hydronephrosis and left renal atrophy, 1 cm right upper pole nonobstructing stone  Physical Exam: BP (!) 160/106 (BP Location: Left Arm, Patient Position: Sitting, Cuff Size: Large)   SpO2 98%    Imaging/labs: I personally viewed and interpreted the CT from 01/06/2024 showing a 1.8 cm left mid ureteral stone with severe left hydronephrosis and left renal atrophy, and a 1 cm right upper pole nonobstructing stone NM renal scan 02/03/2024 showing obstructive hydronephrosis of the left kidney with 23% function on the left side Creatinine 1.3, eGFR 65 from July 2025 PSA July 2025 normal at 0.5  Today: Continues to report bilateral low back pain, feels mostly related to physical activity and strenuous work Denies any fever, chills, dysuria, gross hematuria  Plan:   Left mid ureteral stone: Likely chronic with severe upstream hydronephrosis and renal atrophy, though function greater than 20% on renal scan and worth attempted ureteroscopy/laser/stent to salvage function.  Risks and benefits discussed extensively, including worsening renal function, ureteral stricture, chronic hydronephrosis. We specifically discussed the risks ureteroscopy including bleeding, infection/sepsis, stent related symptoms including flank pain/urgency/frequency/incontinence/dysuria, ureteral injury, ureteral stricture, inability to access stone, or need for staged or additional procedures. Right upper pole renal stone: Essentially solitary right kidney at this point and would recommend definitive treatment, we discussed if left  side goes well can try to address right side simultaneously, would be concerned about shockwave lithotripsy with having obstructive fragments that could result in anuria/renal failure Schedule left ureteroscopy, laser lithotripsy, stent placement, possible right ureteroscopy/laser/stent Their insurance is changing 03/03/2024 to [facility], will need close follow-up with [facility] for follow-up renal ultrasound and renal function monitoring   Redell JAYSON Burnet, MD  [facility] [address], [address] [place], [place] [address] [phone]

## 2024-02-10 NOTE — Patient Instructions (Signed)
 Laser Therapy for Kidney Stones Laser therapy for kidney stones is a procedure to break up rock-like masses that form inside the kidneys (kidney stones). It is done using a device that beams a strong light (laser) on the kidney stones. This breaks the stones up into small pieces. These small pieces may leave your body when you pee (urinate) or may be taken out during the procedure.  You may need laser therapy if you have kidney stones that are painful or that are stopping you from being able to pee. Tell a health care provider about: Any allergies you have. All medicines you are taking, including vitamins, herbs, eye drops, creams, and over-the-counter medicines. Any problems you or family members have had with anesthesia. Any bleeding problems you have. Any surgeries you have had. Any medical conditions you have. Whether you are pregnant or may be pregnant. What are the risks? Your health care provider will talk with you about risks. These may include: Infection. Bleeding. Allergic reactions to medicines. Damage to: The part of your body that drains pee (urine) from the bladder (urethra). The bladder. The tube that connects the bladder to the kidneys (ureter). Urinary tract infection (UTI). Urethral stricture. This is when the urethra is narrowed by scarring. Trouble peeing. Blockage of the kidney. This may be caused by a piece of kidney stone. What happens before the procedure? When to stop eating and drinking Follow instructions from your provider about what you may eat and drink. These may include: 8 hours before the procedure Stop eating most foods. Do not eat meat, fried foods, or fatty foods. Eat only light foods, such as toast or crackers. All liquids are okay except energy drinks and alcohol. 6 hours before the procedure Stop eating. Drink only clear liquids, such as water, clear fruit juice, black coffee, plain tea, and sports drinks. Do not drink energy drinks or  alcohol. 2 hours before the procedure Stop drinking all liquids. You may be allowed to take medicines with small sips of water. If you do not follow your provider's instructions, your procedure may be delayed or canceled. Medicines Ask your provider about: Changing or stopping your regular medicines. These include any diabetes medicines or blood thinners you take. Taking medicines such as aspirin and ibuprofen. These medicines can thin your blood. Do not take them unless your provider tells you to. Taking over-the-counter medicines, vitamins, herbs, and supplements. Tests You may have a physical exam before the procedure. You may also have tests done. These may include: Imaging tests. Blood or pee tests. Surgery safety Ask your provider: How your surgery site will be marked. What steps will be taken to help prevent infection. These steps may include: Removing hair at the surgery site. Washing skin with a soap that kills germs. Taking antibiotics. General instructions Do not use any products that contain nicotine or tobacco for at least 4 weeks before the procedure. These products include cigarettes, chewing tobacco, and vaping devices, such as e-cigarettes. If you need help quitting, ask your provider. If you will be going home right after the procedure, plan to have a responsible adult: Take you home from the hospital or clinic. You will not be allowed to drive. Care for you for the time you are told. What happens during the procedure?  An IV will be inserted into one of your veins. You will be given: A sedative. This helps you relax. Anesthesia. This keeps you from feeling pain. It will make you fall asleep for surgery. A tool  with a camera on the end (ureteroscope) will be put into your urethra. It will be moved through your bladder to your kidney. It will send pictures to a screen in the operating room. This will show what parts of your kidney need to be treated. A tube will be  put through the ureteroscope. It will be moved into your kidney. The laser device will be put into your kidney through the tube. The laser will be used to break up the kidney stones. A tool with a tiny wire basket may be put through the tube into your kidney. This can help remove the small pieces of the kidney stone. A small mesh tube (stent) may be placed to allow your kidney to drain. The tube and ureteroscope will be taken out at the end of the surgery. The procedure may vary among providers and hospitals. What happens after the procedure? Your blood pressure, heart rate, breathing rate, and blood oxygen level will be monitored until you leave the hospital or clinic. If you had a stent placed, it may have a string that will be secured to your skin. This helps your provider remove the stent. You may be given a strainer to collect any stone pieces that you pass in your pee. Your provider may have these tested. This information is not intended to replace advice given to you by your health care provider. Make sure you discuss any questions you have with your health care provider. Ureteral Stent Implantation Ureteral stent implantation is a procedure to insert (implant) a flexible, soft, plastic tube (stent) into a ureter. Ureters are the tubelike parts of the body that drain urine from the kidneys. A ureteral stent may be implanted: After a procedure to remove a blockage from the ureter (ureterolysis or pyeloplasty). To open the flow of urine when a blockage is caused by a kidney stone, tumor, blood clot, or infection. You have two ureters, one on each side of your body. The ureters connect your kidneys to your bladder. The stent is placed so that one end is in your kidney, and one end is in your bladder. The stent supports the ureter while it heals and helps to drain urine. The stent is usually taken out after your ureter has healed. Depending on your condition, you may have a stent for just a few  weeks, or you may have a long-term stent that will need to be replaced every few months. Tell a health care provider about: Any allergies you have. All medicines you are taking, including vitamins, herbs, eye drops, creams, and over-the-counter medicines. Any problems you or family members have had with anesthetic medicines. Any bleeding problems you have. Any surgeries you have had. Any medical conditions you have. Whether you are pregnant or may be pregnant. What are the risks? Generally, this is a safe procedure. However, problems may occur, including: Infection. Bleeding. Allergic reactions to medicines. Damage to nearby structures or organs, such as tearing (perforation) of the ureter. Movement of the stent away from where it is placed during surgery (migration). Buildup of a crust or hard coating (encrustation) on the stent. This happens when bacteria in the body form crystals on the stent, causing it to weaken. What happens before the procedure? Medicines Ask your health care provider about: Changing or stopping your regular medicines. These include any diabetes medicines or blood thinners you take. Taking medicines such as aspirin and ibuprofen. These medicines can thin your blood. Do not take them unless your health care provider  tells you to. Taking over-the-counter medicines, vitamins, herbs, and supplements. When to stop eating and drinking Follow instructions from your health care provider about what you may eat and drink. These may include: 8 hours before your procedure Stop eating most foods. Do not eat meat, fried foods, or fatty foods. Eat only light foods, such as toast or crackers. All liquids are okay except energy drinks and alcohol. 6 hours before your procedure Stop eating. Drink only clear liquids, such as water, clear fruit juice, black coffee, plain tea, and sports drinks. Do not drink energy drinks or alcohol. 2 hours before your procedure Stop drinking  all liquids. You may be allowed to take medicines with small sips of water. If you do not follow your health care provider's instructions, your procedure may be delayed or canceled. General instructions Do not use any products that contain nicotine or tobacco for at least 4 weeks before the procedure. These products include cigarettes, chewing tobacco, and vaping devices, such as e-cigarettes. If you need help quitting, ask your health care provider. You may have an exam or testing, such as imaging or blood tests. If you will be going home right after the procedure, plan to have a responsible adult: Take you home from the hospital or clinic. You will not be allowed to drive. Care for you for the time you are told. Ask your health care provider what steps will be taken to help prevent infection. These steps may include: Removing hair at the surgery site. Washing skin with a soap that kills germs. Taking antibiotic medicine. What happens during the procedure? An IV will be inserted into one of your veins. You may be given: A medicine to help you relax (sedative). A medicine to make you fall asleep (general anesthetic). A thin, tube-shaped instrument with a light and tiny camera at the end (cystoscope) will be inserted into your urethra. The urethra is the part of your body that drains urine from the bladder. The urethra opens at the end of the penis or in front of the vaginal opening. The cystoscope will be passed into your bladder. Guided imagery using X-ray may be used to pass a thin wire (guide wire) through your bladder and into your ureter. This wire is used to guide the stent into your ureter. The stent will be inserted into your ureter. The guide wire and the cystoscope will be removed. A thin, flexible tube (catheter) may be put through your urethra so that one end is in your bladder. This helps to drain urine from your bladder. The procedure may vary among hospitals and health care  providers. What happens after the procedure? Your blood pressure, heart rate, breathing rate, and blood oxygen level will be monitored until you leave the hospital or clinic. You may continue to get medicine and fluids through an IV. You may have some soreness or pain in your abdomen and urethra. You may be given medicines for this. You will be encouraged to get up and walk around as soon as you can. You may have a catheter draining your urine. Summary Ureteral stent implantation is a procedure to insert a flexible, soft, plastic tube (stent) into a ureter. You may have a stent implanted to support the ureter while it heals after a procedure or to open the flow of urine if there is a blockage. You may have a stent for just a few weeks, or you may have a long-term stent that will need to be replaced every few months.  Follow instructions from your health care provider about taking medicines and about eating and drinking before the procedure. This information is not intended to replace advice given to you by your health care provider. Make sure you discuss any questions you have with your health care provider. Document Revised: 03/25/2021 Document Reviewed: 03/25/2021 Elsevier Patient Education  2024 [employer].

## 2024-02-10 NOTE — Progress Notes (Signed)
° °  [facility] Surgical Posting Form  Surgery Date: Date: 02/22/2024  Surgeon: Dr. Redell Burnet, MD  Inpt ( No  )   Outpt (Yes)   Obs ( No  )   Diagnosis: N20.1 Left Ureteral Stone, N20.0 Right Renal Stone  -CPT: 47643, [phone], [phone]  Surgery: Left Ureteroscopy with Laser Lithotripsy and Stent Placement with Possible Right Ureteroscopy, Laser Lithotripsy and Stent Placement  Stop Anticoagulations: No, may continue all  Cardiac/Medical/Pulmonary Clearance needed: no  *Orders entered into EPIC  Date: 02/10/24   *Case booked in [place]  Date: 02/10/24  *Notified pt of Surgery: Date: 02/10/24  PRE-OP UA & CX: yes, obtained in clinic today on 02/10/2024  *Placed into Prior Authorization Work [place] Date: 02/10/24  Assistant/laser/rep:No

## 2024-02-10 NOTE — Progress Notes (Signed)
 Surgical Physician Order Form [facility] [place]  Dr. Redell Burnet, MD  * Scheduling expectation : 12/22  *Length of Case: 1.5 hours  *Clearance needed: no  *Anticoagulation Instructions: May continue all anticoagulants  *Aspirin Instructions: Ok to continue Aspirin  *Post-op visit Date/Instructions: 10-14-day stent removal  *Diagnosis: Left Ureteral Stone, right renal stone  *Procedure: left Ureteroscopy w/laser lithotripsy & stent placement (47643), possible right ureteroscopy/laser/stent   Additional orders: N/A  -Admit type: OUTpatient  -Anesthesia: General  -VTE Prophylaxis Standing Order SCD's       Other:   -Standing Lab Orders Per Anesthesia    Lab other: UA&Urine Culture 12/10  -Standing Test orders EKG/Chest x-ray per Anesthesia       Test other:   - Medications:  Ancef 2gm IV  -Other orders:  N/A

## 2024-02-10 NOTE — Telephone Encounter (Signed)
 Per Dr. Francisca, Patient is to be scheduled for Left Ureteroscopy with Laser Lithotripsy and Stent Placement with Possible Right Ureteroscopy, Laser Lithotripsy and Stent Placement   Isaac [name] was contacted and possible surgical dates were discussed, Monday December 22nd, 2025 was agreed upon for surgery.   Patient was directed to call [phone] between 1-3pm the day before surgery to find out surgical arrival time.  Instructions were given not to eat or drink from midnight on the night before surgery and have a driver for the day of surgery. On the surgery day patient was instructed to enter through the Medical Mall entrance of [facility] report the Same Day Surgery desk.   Pre-Admit Testing will be in contact via phone to set up an interview with the anesthesia team to review your history and medications prior to surgery.   Reminder of this information was sent via MyChart to the patient.

## 2024-02-15 ENCOUNTER — Encounter
Admission: RE | Admit: 2024-02-15 | Discharge: 2024-02-15 | Disposition: A | Source: Ambulatory Visit | Attending: Urology

## 2024-02-15 ENCOUNTER — Other Ambulatory Visit: Payer: Self-pay

## 2024-02-15 ENCOUNTER — Inpatient Hospital Stay
Admission: RE | Admit: 2024-02-15 | Discharge: 2024-02-15 | Disposition: A | Source: Ambulatory Visit | Attending: Urology

## 2024-02-15 DIAGNOSIS — Z01818 Encounter for other preprocedural examination: Secondary | ICD-10-CM

## 2024-02-15 DIAGNOSIS — I10 Essential (primary) hypertension: Secondary | ICD-10-CM | POA: Diagnosis not present

## 2024-02-15 DIAGNOSIS — N200 Calculus of kidney: Secondary | ICD-10-CM

## 2024-02-15 DIAGNOSIS — Z01810 Encounter for preprocedural cardiovascular examination: Secondary | ICD-10-CM | POA: Diagnosis present

## 2024-02-15 DIAGNOSIS — Z01812 Encounter for preprocedural laboratory examination: Secondary | ICD-10-CM | POA: Diagnosis present

## 2024-02-15 LAB — BASIC METABOLIC PANEL WITH GFR
Anion gap: 12 (ref 5–15)
BUN: 19 mg/dL (ref 6–20)
CO2: 22 mmol/L (ref 22–32)
Calcium: 9.7 mg/dL (ref 8.9–10.3)
Chloride: 103 mmol/L (ref 98–111)
Creatinine, Ser: 1.33 mg/dL — ABNORMAL HIGH (ref 0.61–1.24)
GFR, Estimated: >60 mL/min (ref >60)
Glucose, Bld: 107 mg/dL — ABNORMAL HIGH (ref 70–99)
Potassium: 4.3 mmol/L (ref 3.5–5.1)
Sodium: 137 mmol/L (ref 135–145)

## 2024-02-15 LAB — CBC
HCT: 51.7 % (ref 39.0–52.0)
Hemoglobin: 18.6 g/dL — ABNORMAL HIGH (ref 13.0–17.0)
MCH: 30 pg (ref 26.0–34.0)
MCHC: 36 g/dL (ref 30.0–36.0)
MCV: 83.4 fL (ref 80.0–100.0)
Platelets: 282 K/uL (ref 150–400)
RBC: 6.2 MIL/uL — ABNORMAL HIGH (ref 4.22–5.81)
RDW: 12.6 % (ref 11.5–15.5)
WBC: 9.2 K/uL (ref 4.0–10.5)
nRBC: 0 % (ref 0.0–0.2)

## 2024-02-15 NOTE — Patient Instructions (Addendum)
 Your procedure is scheduled on: MONDAY 02/22/24 Report to the Registration Desk on the 1st floor of the Medical Mall. To find out your arrival time, please call [phone] between 1PM - 3PM on: FRIDAY 02/19/24 If your arrival time is 6:00 am, do not arrive before that time as the Medical Mall entrance doors do not open until 6:00 am.  REMEMBER: Instructions that are not followed completely may result in serious medical risk, up to and including death; or upon the discretion of your surgeon and anesthesiologist your surgery may need to be rescheduled.  Do not eat food after midnight the night before surgery.  No gum chewing or hard candies.  One week prior to surgery: Stop Anti-inflammatories (NSAIDS) such as Advil, Aleve , Ibuprofen, Motrin, Naproxen , Naprosyn  and Aspirin based products such as Excedrin, Goody's Powder, BC Powder. Stop ANY OVER THE COUNTER supplements until after surgery.  You may however, continue to take Tylenol if needed for pain up until the day of surgery.  Continue taking all of your other prescription medications up until the day of surgery.  ON THE DAY OF SURGERY ONLY TAKE THESE MEDICATIONS WITH SIPS OF WATER :  NONE  Use inhalers on the day of surgery and bring to the hospital.  No Alcohol for 24 hours before or after surgery.  No Smoking including e-cigarettes for 24 hours before surgery.  No chewable tobacco products for at least 6 hours before surgery.  No nicotine patches on the day of surgery.  Do not use any recreational drugs for at least a week (preferably 2 weeks) before your surgery.  Please be advised that the combination of cocaine and anesthesia may have negative outcomes, up to and including death. If you test positive for cocaine, your surgery will be cancelled.  On the morning of surgery brush your teeth with toothpaste and water , you may rinse your mouth with mouthwash if you wish. Do not swallow any toothpaste or mouthwash.   Do  not wear jewelry, make-up, hairpins, clips or nail polish.  For welded (permanent) jewelry: bracelets, anklets, waist bands, etc.  Please have this removed prior to surgery.  If it is not removed, there is a chance that hospital personnel will need to cut it off on the day of surgery.  Do not wear lotions, powders, or perfumes.   Do not shave body hair from the neck down 48 hours before surgery.  Contact lenses, hearing aids and dentures may not be worn into surgery.  Do not bring valuables to the hospital. [facility] is not responsible for any missing/lost belongings or valuables.   Bring your C-PAP to the hospital in case you may have to spend the night.   Notify your doctor if there is any change in your medical condition (cold, fever, infection).  Wear comfortable clothing (specific to your surgery type) to the hospital.  After surgery, you can help prevent lung complications by doing breathing exercises.  Take deep breaths and cough every 1-2 hours. Your doctor may order a device called an Incentive Spirometer to help you take deep breaths.  If you are being discharged the day of surgery, you will not be allowed to drive home. You will need a responsible individual to drive you home and stay with you for 24 hours after surgery.   If you are taking public transportation, you will need to have a responsible individual with you.  Please call the Pre-admissions Testing Dept. at [phone] if you have any questions about these  instructions.  Surgery Visitation Policy:  Patients having surgery or a procedure may have two visitors.  Children under the age of 16 must have an adult with them who is not the patient.  Merchandiser, Retail to address health-related social needs:  [email or web address]

## 2024-02-16 LAB — CULTURE, URINE COMPREHENSIVE

## 2024-02-21 MED ORDER — CEFAZOLIN SODIUM-DEXTROSE 2-4 GM/100ML-% IV SOLN
2.0000 g | INTRAVENOUS | Status: AC
  Administered 2024-02-22: 2 g via INTRAVENOUS

## 2024-02-21 MED ORDER — ORAL CARE MOUTH RINSE: 15.0000 mL | Freq: Once | OROMUCOSAL | Status: AC

## 2024-02-21 MED ORDER — CHLORHEXIDINE GLUCONATE 0.12 % MT SOLN
15.0000 mL | Freq: Once | OROMUCOSAL | Status: AC
  Administered 2024-02-22: 15 mL via OROMUCOSAL

## 2024-02-21 MED ORDER — LACTATED RINGERS IV SOLN: INTRAVENOUS | Status: DC

## 2024-02-22 ENCOUNTER — Other Ambulatory Visit: Payer: Self-pay | Admitting: Urology

## 2024-02-22 ENCOUNTER — Ambulatory Visit
Admission: RE | Admit: 2024-02-22 | Discharge: 2024-02-22 | Disposition: A | Discharge status: Home / Self Care | Attending: Urology | Admitting: Urology

## 2024-02-22 ENCOUNTER — Encounter: Admission: RE | Disposition: A | Payer: Self-pay | Source: Home / Self Care | Attending: Urology

## 2024-02-22 ENCOUNTER — Ambulatory Visit

## 2024-02-22 ENCOUNTER — Ambulatory Visit: Admitting: Certified Registered"

## 2024-02-22 ENCOUNTER — Encounter: Payer: Self-pay | Admitting: Urology

## 2024-02-22 ENCOUNTER — Other Ambulatory Visit: Payer: Self-pay

## 2024-02-22 ENCOUNTER — Encounter: Payer: Self-pay | Admitting: Urgent Care

## 2024-02-22 DIAGNOSIS — N200 Calculus of kidney: Secondary | ICD-10-CM

## 2024-02-22 DIAGNOSIS — N132 Hydronephrosis with renal and ureteral calculous obstruction: Secondary | ICD-10-CM | POA: Insufficient documentation

## 2024-02-22 DIAGNOSIS — E66813 Obesity, class 3: Secondary | ICD-10-CM | POA: Insufficient documentation

## 2024-02-22 DIAGNOSIS — Z6834 Body mass index (BMI) 34.0-34.9, adult: Secondary | ICD-10-CM | POA: Diagnosis not present

## 2024-02-22 DIAGNOSIS — I10 Essential (primary) hypertension: Secondary | ICD-10-CM | POA: Insufficient documentation

## 2024-02-22 DIAGNOSIS — N201 Calculus of ureter: Secondary | ICD-10-CM | POA: Diagnosis not present

## 2024-02-22 HISTORY — PX: CYSTOSCOPY/URETEROSCOPY/HOLMIUM LASER/STENT PLACEMENT: SHX6546

## 2024-02-22 HISTORY — PX: CYSTOSCOPY/URETEROSCOPY/HOLMIUM LASER: SHX6545

## 2024-02-22 SURGERY — CYSTOSCOPY/URETEROSCOPY/HOLMIUM LASER/STENT PLACEMENT
Anesthesia: General | Laterality: Right

## 2024-02-22 MED ORDER — SUCCINYLCHOLINE CHLORIDE 200 MG/10ML IV SOSY
PREFILLED_SYRINGE | INTRAVENOUS | Status: DC | PRN
  Administered 2024-02-22: 100 mg via INTRAVENOUS

## 2024-02-22 MED ORDER — ROCURONIUM BROMIDE 100 MG/10ML IV SOLN
INTRAVENOUS | Status: DC | PRN
  Administered 2024-02-22 (×2): 10 mg via INTRAVENOUS
  Administered 2024-02-22: 40 mg via INTRAVENOUS
  Administered 2024-02-22 (×2): 10 mg via INTRAVENOUS

## 2024-02-22 MED ORDER — PROPOFOL 10 MG/ML IV BOLUS
INTRAVENOUS | Status: DC | PRN
  Administered 2024-02-22: 160 mg via INTRAVENOUS

## 2024-02-22 MED ORDER — DEXAMETHASONE SOD PHOSPHATE PF 10 MG/ML IJ SOLN
INTRAMUSCULAR | Status: DC | PRN
  Administered 2024-02-22: 10 mg via INTRAVENOUS

## 2024-02-22 MED ORDER — ONDANSETRON HCL 4 MG/2ML IJ SOLN
INTRAMUSCULAR | Status: DC | PRN
  Administered 2024-02-22 (×2): 4 mg via INTRAVENOUS

## 2024-02-22 MED ORDER — CEFAZOLIN SODIUM-DEXTROSE 2-4 GM/100ML-% IV SOLN
INTRAVENOUS | Status: AC
  Filled 2024-02-22: qty 100

## 2024-02-22 MED ORDER — FENTANYL CITRATE (PF) 100 MCG/2ML IJ SOLN
INTRAMUSCULAR | Status: DC | PRN
  Administered 2024-02-22 (×2): 50 ug via INTRAVENOUS

## 2024-02-22 MED ORDER — TRAMADOL HCL 50 MG PO TABS: 25.0000 mg | ORAL_TABLET | Freq: Four times a day (QID) | ORAL | 0 refills | Status: AC | PRN

## 2024-02-22 MED ORDER — LIDOCAINE HCL (CARDIAC) PF 100 MG/5ML IV SOSY
PREFILLED_SYRINGE | INTRAVENOUS | Status: DC | PRN
  Administered 2024-02-22: 100 mg via INTRAVENOUS

## 2024-02-22 MED ORDER — SODIUM CHLORIDE 0.9 % IR SOLN
Status: DC | PRN
  Administered 2024-02-22: 1

## 2024-02-22 MED ORDER — IOHEXOL 180 MG/ML  SOLN
INTRAMUSCULAR | Status: DC | PRN
  Administered 2024-02-22 (×2): 10 mL

## 2024-02-22 MED ORDER — ACETAMINOPHEN 10 MG/ML IV SOLN
INTRAVENOUS | Status: DC | PRN
  Administered 2024-02-22: 1000 mg via INTRAVENOUS

## 2024-02-22 MED ORDER — SUGAMMADEX SODIUM 200 MG/2ML IV SOLN
INTRAVENOUS | Status: DC | PRN
  Administered 2024-02-22: 200 mg via INTRAVENOUS

## 2024-02-22 MED ORDER — GLYCOPYRROLATE 0.2 MG/ML IJ SOLN
INTRAMUSCULAR | Status: DC | PRN
  Administered 2024-02-22: .2 mg via INTRAVENOUS

## 2024-02-22 MED ORDER — FENTANYL CITRATE (PF) 100 MCG/2ML IJ SOLN
INTRAMUSCULAR | Status: AC
  Filled 2024-02-22: qty 2

## 2024-02-22 MED ORDER — DEXMEDETOMIDINE HCL IN NACL 200 MCG/50ML IV SOLN
INTRAVENOUS | Status: DC | PRN
  Administered 2024-02-22: 20 ug via INTRAVENOUS

## 2024-02-22 SURGICAL SUPPLY — 16 items
BAG DRAIN SIEMENS DORNER NS (MISCELLANEOUS) ×2 IMPLANT
BRUSH SCRUB EZ 4% CHG (MISCELLANEOUS) ×2 IMPLANT
CATH URETL OPEN 5X70 (CATHETERS) IMPLANT
DRAPE UTILITY 15X26 TOWEL STRL (DRAPES) ×2 IMPLANT
FIBER LASER MOSES 365 DFL (Laser) IMPLANT
GLOVE BIOGEL PI IND STRL 7.5 (GLOVE) ×4 IMPLANT
GOWN STRL REUS W/ TWL LRG LVL3 (GOWN DISPOSABLE) ×2 IMPLANT
GOWN STRL REUS W/ TWL XL LVL3 (GOWN DISPOSABLE) ×2 IMPLANT
GUIDEWIRE STR DUAL SENSOR (WIRE) ×2 IMPLANT
KIT TURNOVER CYSTO (KITS) ×2 IMPLANT
PACK CYSTO AR (MISCELLANEOUS) ×2 IMPLANT
SOL .9 NS 3000ML IRR UROMATIC (IV SOLUTION) ×2 IMPLANT
SOLN STERILE WATER 500 ML (IV SOLUTION) ×2 IMPLANT
STENT URET 6FRX26 CONTOUR (STENTS) IMPLANT
SURGILUBE 2OZ TUBE FLIPTOP (MISCELLANEOUS) ×2 IMPLANT
SYR 10ML LL (SYRINGE) ×2 IMPLANT

## 2024-02-22 NOTE — Op Note (Signed)
 Date of procedure: 02/22/2024  Preoperative diagnosis:  Left ureteral stone  Postoperative diagnosis:  Same  Procedure: Cystoscopy, left ureteroscopy, laser lithotripsy, left retrograde pyelogram with intraoperative interpretation, left ureteral stent placement  Surgeon: Redell Burnet, MD  Anesthesia: General  Complications: None  Intraoperative findings:  Impacted 2 cm left mid ureteral stent, approximately 80% fragmented, limited vision secondary to stone dust which prevented complete fragmentation of stone Severe left hydronephrosis, uncomplicated stent placement  EBL: Minimal  Specimens: None  Drains: Left 6 French by 26 cm ureteral stent  Indication: Isaac [name] is a 53 y.o. patient with CT showing a 2 cm left mid ureteral stone with severe hydronephrosis, NM renal scan showed 23% residual function and he opted for left ureteroscopy, laser lithotripsy, stent placement.  Also with 6 mm nonobstructing right upper pole stone.    After reviewing the management options for treatment, they elected to proceed with the above surgical procedure(s). We have discussed the potential benefits and risks of the procedure, side effects of the proposed treatment, the likelihood of the patient achieving the goals of the procedure, and any potential problems that might occur during the procedure or recuperation. Informed consent has been obtained.  Description of procedure:  The patient was taken to the operating room and general anesthesia was induced. SCDs were placed for DVT prophylaxis. The patient was placed in the dorsal lithotomy position, prepped and draped in the usual sterile fashion, and preoperative antibiotics were administered. A preoperative time-out was performed.   21 French rigid cystoscope was used to intubate the urethra and thorough cystoscopy was performed.  The prostate was small.  No suspicious bladder lesions, and ureteral orifices orthotopic bilaterally.  I  attempted to advance a sensor wire alongside the large left mid ureteral stone but continued to meet resistance, 5 French access catheter was also used but the wire coiled.  At this point I opted to advance the semirigid long ureteroscope alongside the wire to below the level of the stone.  Under direct vision there was a large black calcium  oxalate appearing stone that encompassed the entire ureter and appeared to be impacted.  A 365 m laser fiber on settings of 1.0 J and 10 Hz was used to methodically dust the stone.  Very challenging secondary to ureteral edema and large impacted stone.  Ultimately I was able to fragment approximately 80% of the stone based on fluoroscopy before vision was extremely poor secondary to stone dust and fragments.  I was able to navigate the semirigid ureteroscope past the stone fragments into the proximal dilated ureter.  A retrograde pyelogram showed dilated proximal ureter and severe hydronephrosis.  A sensor wire was advanced through the ureteroscope into the left kidney.  Pullback ureteroscopy showed some residual stone fragments, but again vision was significantly limited from stone dust and ureteral edema.  Suspect approximately 80% of stone had been dusted.  A 5 French access catheter was advanced over the wire and the wire removed.  Hydronephrotic drip was noted.  Contrast was injected showing severe left hydronephrosis.  A rigid cystoscope was backloaded over the wire and a 6 French by 26 cm ureteral stent was placed uneventfully with a curl in the kidney as well as in the bladder under direct vision.  Disposition: Stable to PACU  Plan: -Will need follow-up left ureteroscopy, laser lithotripsy, stent change in 2 to 4 weeks for completion of left mid ureteral stone, can also consider addressing right renal stone at that time -His insurance changes  January 1, they are unsure if they would like to follow-up with us  or [facility]  Redell Burnet, MD

## 2024-02-22 NOTE — Anesthesia Procedure Notes (Signed)
 Procedure Name: Intubation Date/Time: 02/22/2024 2:07 PM  Performed by: Ledora Duncan, CRNAPre-anesthesia Checklist: Patient identified, Emergency Drugs available, Suction available and Patient being monitored Patient Re-evaluated:Patient Re-evaluated prior to induction Oxygen Delivery Method: Circle system utilized Preoxygenation: Pre-oxygenation with 100% oxygen Induction Type: IV induction Ventilation: Mask ventilation without difficulty Laryngoscope Size: McGrath and 3 Grade View: Grade I Tube type: Oral Tube size: 7.0 mm Number of attempts: 1 Airway Equipment and Method: Stylet Placement Confirmation: ETT inserted through vocal cords under direct vision, positive ETCO2 and breath sounds checked- equal and bilateral Secured at: 21 cm Tube secured with: Tape Dental Injury: Teeth and Oropharynx as per pre-operative assessment

## 2024-02-22 NOTE — Transfer of Care (Signed)
 Immediate Anesthesia Transfer of Care Note  Patient: Isaac [name]  Procedure(s) Performed: CYSTOSCOPY/URETEROSCOPY/HOLMIUM LASER/STENT PLACEMENT (Left) CYSTOURETEROSCOPY, USING HOLMIUM LASER (Right)  Patient Location: PACU  Anesthesia Type:General  Level of Consciousness: awake, drowsy, and patient cooperative  Airway & Oxygen Therapy: Patient Spontanous Breathing and Patient connected to face mask oxygen  Post-op Assessment: Report given to RN and Post -op Vital signs reviewed and stable  Post vital signs: Reviewed and stable  Last Vitals:  Vitals Value Taken Time  BP 146/81 02/22/24 15:07  Temp    Pulse 70 02/22/24 15:09  Resp 12 02/22/24 15:09  SpO2 96 % 02/22/24 15:09  Vitals shown include unfiled device data.  Last Pain:  Vitals:   02/22/24 1059  TempSrc: Oral         Complications: No notable events documented.

## 2024-02-22 NOTE — H&P (Signed)
" ° °  02/22/2024 2:00 PM   Isaac [name] 1970-12-10 [id number]  CC: Left ureteral stone, right renal stone  HPI: 53 year old male found to have likely left mid ureteral stone with hydronephrosis and renal atrophy, NM renal scan showed 23% function and he opted for ureteroscopy to try to salvage the left renal function.  Also has a 6 mm nonobstructive right renal stone, and with his left renal atrophy we will try to address right nonobstructive renal stone simultaneously.   PMH: Past Medical History:  Diagnosis Date   GERD (gastroesophageal reflux disease)    Hypertension     Surgical History: Past Surgical History:  Procedure Laterality Date   COLONOSCOPY WITH PROPOFOL  N/A 06/27/2022   Procedure: COLONOSCOPY WITH PROPOFOL ;  Surgeon: Jinny Carmine, MD;  Location: [facility];  Service: Endoscopy;  Laterality: N/A;   POLYPECTOMY  06/27/2022   Procedure: POLYPECTOMY;  Surgeon: Jinny Carmine, MD;  Location: [facility];  Service: Endoscopy;;   THROAT SURGERY       Family History: Family History  Problem Relation Age of Onset   Stroke Mother    Cancer Mother    Hypertension Mother    Hypertension Father    Stroke Father    Diabetes Father    Heart disease Father    Hypertension Sister     Social History:  reports that he has never smoked. He has never used smokeless tobacco. He reports current alcohol use. He reports that he does not use drugs.  Physical Exam: BP (!) 183/63 (BP Location: Left Arm)   Pulse 74   Temp (!) 97.4 F (36.3 C) (Oral)   Resp 20   SpO2 100%    Constitutional:  Alert and oriented, No acute distress. Cardiovascular: Regular rate and rhythm Respiratory: Clear to auscultation bilaterally GI: Abdomen is soft, nontender, nondistended, no abdominal masses   Laboratory Data: Culture no growth   Assessment & Plan:   53 year old male with large left mid ureteral stone and left hydronephrosis and renal atrophy, NM renal scan shows  23% function and he opted for ureteroscopy to try to salvage kidney function.  Will also try to address nonobstructive right renal stone simultaneously.  We specifically discussed the risks ureteroscopy including bleeding, infection/sepsis, stent related symptoms including flank pain/urgency/frequency/incontinence/dysuria, ureteral injury, ureteral stricture, inability to access stone, or need for staged or additional procedures.  Left ureteroscopy, laser lithotripsy, stent placement, possible right ureteroscopy, laser lithotripsy, stent placement   Redell Burnet, MD 02/22/2024  [facility] [address], [address] [place], [place] [address] [phone]   "

## 2024-02-22 NOTE — Anesthesia Preprocedure Evaluation (Addendum)
"                                    Anesthesia Evaluation  Patient identified by MRN, date of birth, ID band Patient awake    Reviewed: Allergy & Precautions, NPO status , Patient's Chart, lab work & pertinent test results  Airway Mallampati: II  TM Distance: >3 FB Neck ROM: Full    Dental  (+) Chipped   Pulmonary neg pulmonary ROS   Pulmonary exam normal breath sounds clear to auscultation       Cardiovascular hypertension, Pt. on medications Normal cardiovascular exam Rhythm:Regular Rate:Normal     Neuro/Psych negative neurological ROS  negative psych ROS   GI/Hepatic negative GI ROS, Neg liver ROS,GERD  Medicated,,  Endo/Other  negative endocrine ROS  Class 3 obesity  Renal/GU negative Renal ROS  negative genitourinary   Musculoskeletal   Abdominal  (+) + obese  Peds negative pediatric ROS (+)  Hematology negative hematology ROS (+)   Anesthesia Other Findings Past Medical History: No date: GERD (gastroesophageal reflux disease) No date: Hypertension  Past Surgical History: 06/27/2022: COLONOSCOPY WITH PROPOFOL ; N/A     Comment:  Procedure: COLONOSCOPY WITH PROPOFOL ;  Surgeon: Jinny Carmine, MD;  Location: [facility];  Service:               Endoscopy;  Laterality: N/A; 06/27/2022: POLYPECTOMY     Comment:  Procedure: POLYPECTOMY;  Surgeon: Jinny Carmine, MD;                Location: [facility];  Service: Endoscopy;; No date: THROAT SURGERY     Reproductive/Obstetrics negative OB ROS                              Anesthesia Physical Anesthesia Plan  ASA: 2  Anesthesia Plan: General   Post-op Pain Management:    Induction: Intravenous  PONV Risk Score and Plan: Propofol  infusion and TIVA  Airway Management Planned: Oral ETT  Additional Equipment:   Intra-op Plan:   Post-operative Plan: Extubation in OR  Informed Consent: I have reviewed the patients History and  Physical, chart, labs and discussed the procedure including the risks, benefits and alternatives for the proposed anesthesia with the patient or authorized representative who has indicated his/her understanding and acceptance.     Dental Advisory Given  Plan Discussed with: CRNA  Anesthesia Plan Comments:          Anesthesia Quick Evaluation  "

## 2024-02-23 ENCOUNTER — Telehealth: Payer: Self-pay

## 2024-02-23 ENCOUNTER — Encounter: Payer: Self-pay | Admitting: Urology

## 2024-02-23 DIAGNOSIS — N201 Calculus of ureter: Secondary | ICD-10-CM

## 2024-02-23 NOTE — Progress Notes (Signed)
" ° °  [facility] Surgical Posting Form  Surgery Date: Date: 03/28/2024  Surgeon: Dr. Redell Burnet, MD  Inpt ( No  )   Outpt (Yes)   Obs ( No  )   Diagnosis: N20.1 Left Ureteral Stone, N20.1 Right Ureteral Stone  -CPT: 47643, [phone], [phone]  Surgery: Left Ureteroscopy with Laser Lithotripsy and Stent Exchange, Possible Right Ureteroscopy with Laser Lithotripsy  Stop Anticoagulations: Yes  Cardiac/Medical/Pulmonary Clearance needed: no  *Orders entered into EPIC  Date: 02/23/2024   *Case booked in [place]  Date: 02/23/2024  *Notified pt of Surgery: Date: 02/23/2024  PRE-OP UA & CX: yes, will obtain at PCP office on 03/09/2024  *Placed into Prior Authorization Work [place] Date: 02/23/2024  Assistant/laser/rep:No                "

## 2024-02-23 NOTE — Progress Notes (Signed)
 Surgical Physician Order Form [facility] [place]  Dr. Redell Burnet, MD  * Scheduling expectation : 2 to 4 weeks  *Length of Case: 2 hours  *Clearance needed: no  *Anticoagulation Instructions: May continue all anticoagulants  *Aspirin Instructions: Ok to continue all  *Post-op visit Date/Instructions: Stent removal 10 to 14 days  *Diagnosis: Left Ureteral Stone, right ureteral stone  *Procedure: left  Ureteroscopy w/laser lithotripsy & stent exchange (47643), possible right ureteroscopy and laser lithotripsy   Additional orders: N/A  -Admit type: OUTpatient  -Anesthesia: General  -VTE Prophylaxis Standing Order SCD's       Other:   -Standing Lab Orders Per Anesthesia    Lab other: UA&Urine Culture  -Standing Test orders EKG/Chest x-ray per Anesthesia       Test other:   - Medications:  Ancef  2gm IV  -Other orders:  N/A  His insurance changes January 1, and they may prefer to follow-up with [facility].  If they opt to follow-up with [facility] please let me know so I can help facilitate him being seen there

## 2024-02-23 NOTE — Anesthesia Postprocedure Evaluation (Signed)
"   Anesthesia Post Note  Patient: Isaac [name]  Procedure(s) Performed: CYSTOSCOPY/URETEROSCOPY/HOLMIUM LASER/STENT PLACEMENT (Left) CYSTOURETEROSCOPY, USING HOLMIUM LASER (Right)  Anesthesia Type: General Pain management: satisfactory to patient Vital Signs Assessment: post-procedure vital signs reviewed and stable Respiratory status: spontaneous breathing Cardiovascular status: blood pressure returned to baseline Anesthetic complications: no   No notable events documented.   Last Vitals:  Vitals:   02/22/24 1530 02/22/24 1538  BP: 114/69 129/86  Pulse: 63 71  Resp: 14 16  Temp:  36.6 C  SpO2: 94% 97%    Last Pain:  Vitals:   02/22/24 1538  TempSrc: Temporal  PainSc: 0-No pain                 VAN STAVEREN,Kadesha Virrueta      "

## 2024-02-23 NOTE — Telephone Encounter (Signed)
 Per Dr. Francisca, Patient is to be scheduled for  Left Ureteroscopy with Laser Lithotripsy and Stent Exchange, Possible Right Ureteroscopy with Laser Lithotripsy   Mr. Isaac [name] was contacted and possible surgical dates were discussed, Monday January 26th, 2026 was agreed upon for surgery.   Patient was instructed that Dr. Francisca will require them to provide a pre-op UA & CX prior to surgery. This was ordered and scheduled drop off appointment was made for 03/09/2024.    Patient was directed to call [phone] between 1-3pm the day before surgery to find out surgical arrival time.  Instructions were given not to eat or drink from midnight on the night before surgery and have a driver for the day of surgery. On the surgery day patient was instructed to enter through the Medical Mall entrance of [facility] report the Same Day Surgery desk.   Pre-Admit Testing will be in contact via phone to set up an interview with the anesthesia team to review your history and medications prior to surgery.   Reminder of this information was sent via MyChart to the patient.

## 2024-03-08 ENCOUNTER — Encounter: Admitting: Urology

## 2024-03-09 ENCOUNTER — Ambulatory Visit (INDEPENDENT_AMBULATORY_CARE_PROVIDER_SITE_OTHER): Admitting: Family Medicine

## 2024-03-09 ENCOUNTER — Encounter: Payer: Self-pay | Admitting: Family Medicine

## 2024-03-09 VITALS — BP 106/78 | HR 84 | Ht 69.0 in | Wt 222.0 lb

## 2024-03-09 DIAGNOSIS — E782 Mixed hyperlipidemia: Secondary | ICD-10-CM | POA: Insufficient documentation

## 2024-03-09 DIAGNOSIS — I10 Essential (primary) hypertension: Secondary | ICD-10-CM | POA: Insufficient documentation

## 2024-03-09 DIAGNOSIS — R7303 Prediabetes: Secondary | ICD-10-CM

## 2024-03-09 DIAGNOSIS — N1831 Chronic kidney disease, stage 3a: Secondary | ICD-10-CM | POA: Insufficient documentation

## 2024-03-09 DIAGNOSIS — N200 Calculus of kidney: Secondary | ICD-10-CM | POA: Diagnosis not present

## 2024-03-09 MED ORDER — ROSUVASTATIN CALCIUM 5 MG PO TABS: 5.0000 mg | ORAL_TABLET | Freq: Every day | ORAL | 1 refills | Status: AC

## 2024-03-09 MED ORDER — LOSARTAN POTASSIUM-HCTZ 50-12.5 MG PO TABS: 1.0000 | ORAL_TABLET | Freq: Every day | ORAL | 1 refills | Status: AC

## 2024-03-09 NOTE — Assessment & Plan Note (Signed)
 The 10-year ASCVD risk score (Arnett DK, et al., 2019) is: 6.4%  Not well controlled with Fenofibrate , stopping Fenofibrate and starting  Crestor .  Side explained. Pt agreed with plan to start medication.

## 2024-03-09 NOTE — Assessment & Plan Note (Signed)
Managed by Urology

## 2024-03-09 NOTE — Progress Notes (Signed)
 "  Established Patient Office Visit  Patient ID: Isaac [name], male    DOB: 10-Oct-1970  Age: 54 y.o. MRN: [id number] PCP: Isaac Hafner K, MD  Chief Complaint  Patient presents with   Hyperlipidemia   Referral    Subjective:     HPI  Discussed the use of AI scribe software for clinical note transcription with the patient, who gave verbal consent to proceed.  History of Present Illness Isaac [name] is a 54 year old male with a history of kidney stones , hyperlipidemia, hypertension,  prediabetes presents to the clinic for chronic medication management.   He began experiencing severe back pain around September, initially suspecting a rib injury. The pain persisted and became unbearable, prompting him to seek urgent care. The patient reports that an x-ray was performed and he was told that a large kidney stone was found on the right side. He reports that a subsequent CT scan showed a large stone on the right and another on the left, which he was told was obstructing the ureter and had caused his kidney to shrink.  He has a history of kidney stones 17 years ago but did not experience left-sided pain until recently. Although he was told the left stone was completely obstructing the ureter, he did not feel pain on that side. He underwent a CT scan and a renal scan, which he reports were delayed due to insurance approval processes, and eventually had surgery on December 22nd. However, the surgery was only partially completed, and he was sent home with tramadol  for pain management.  Following the initial surgery, he experienced back spasms and returned to the emergency room on December 26th, where he underwent another surgery at [facility].  He mentions passing blood after returning to work, which he attributes to aggravating the surgical site. He feels better than he has in six months and notes that his blood pressure, which was elevated due to stress, has since stabilized.  He is  currently taking blood pressure medication and has a history of prediabetes and high cholesterol. He has lost 14 pounds recently and is aware of his strong family history of cardiovascular disease, with his father having died of a heart attack and his mother having had a stroke at 62. He is self-employed in holiday representative, which involves physical labor.     Patient is doing good overall.  Patient is agreeing for lab work.  The 10-year ASCVD risk score (Isaac [name], et al., 2019) is: 6.4%     Review of Systems  All other systems reviewed and are negative.     Objective:     BP 106/78   Pulse 84   Ht 5' 9 (1.753 m)   Wt 222 lb (100.7 kg)   SpO2 95%   BMI 32.78 kg/m     Physical Exam Vitals and nursing note reviewed.  Constitutional:      Appearance: Normal appearance.  HENT:     Head: Normocephalic.     Right Ear: External ear normal.     Left Ear: External ear normal.  Eyes:     Conjunctiva/sclera: Conjunctivae normal.  Cardiovascular:     Rate and Rhythm: Normal rate.  Pulmonary:     Effort: Pulmonary effort is normal. No respiratory distress.  Abdominal:     Palpations: Abdomen is soft.  Musculoskeletal:        General: Normal range of motion.  Skin:    General: Skin is warm.  Neurological:  Mental Status: He is alert and oriented to person, place, and time.  Psychiatric:        Mood and Affect: Mood normal.     Physical Exam VITALS: BP- 160/106   No results found for any visits on 03/09/24.     The 10-year ASCVD risk score (Isaac [name], et al., 2019) is: 6.4%    Assessment & Plan:   Problem List Items Addressed This Visit       Cardiovascular and Mediastinum   Essential hypertension - Primary   BP Readings from Last 3 Encounters:  03/09/24 106/78  02/22/24 129/86  02/10/24 (!) 160/106   Well controlled. Lab Results  Component Value Date   CREATININE 1.33 (H) 02/15/2024   Improved. CCM. Refill sent.        Relevant Medications    losartan -hydrochlorothiazide (HYZAAR) 50-12.5 MG tablet   rosuvastatin  (CRESTOR ) 5 MG tablet     Genitourinary   Bilateral renal stones   Managed by Urology.      CKD stage 3a, GFR 45-59 ml/min (HCC)   Lab Results  Component Value Date   CREATININE 1.33 (H) 02/15/2024   Secondary to renal stones, Improved.  Avoid Aleve , Advil, ibuprofen, motrin and other similar ones. Preferably take Tylenol  if needed for pain.         Other   Mixed hyperlipidemia   The 10-year ASCVD risk score (Isaac [name], et al., 2019) is: 6.4%  Not well controlled with Fenofibrate , stopping Fenofibrate and starting  Crestor .  Side explained. Pt agreed with plan to start medication.      Relevant Medications   losartan -hydrochlorothiazide (HYZAAR) 50-12.5 MG tablet   rosuvastatin  (CRESTOR ) 5 MG tablet   Other Visit Diagnoses       Prediabetes           Assessment and Plan Assessment & Plan Nephrolithiasis with chronic kidney disease stage 3 Chronic kidney disease stage 3 with nephrolithiasis. Recent partial success in kidney stone surgery. Right kidney function at 23%. Stress and medication affect kidney function. Discussed potential kidney loss and ongoing management with urologist. - Continue follow-up with urologist in [facility]. - Avoid NSAIDs; use Tylenol  for mild to moderate pain. - Monitor kidney function and blood pressure.  Essential hypertension Well controlled. Blood pressure fluctuates with stress and medication. - Refilled blood pressure medication with a 90-day supply plus one refill. - Monitor blood pressure regularly, especially during stress.  Mixed hyperlipidemia Family history of cardiovascular disease. Previous medication ineffective. Heart attack risk 6.4%, above statin therapy threshold. Discussed Crestor  side effects and advised on management of muscle cramps. - Switched from fenofibrate to Crestor  5 mg daily. - Monitor for side effects such as muscle cramps. - Educated on  the importance of medication adherence.  Prediabetes Previous lab work indicated prediabetes. Recent weight loss of 14 pounds. - Continue monitoring blood glucose levels. -Diet control    No follow-ups on file.    Isaac [name] Taeden Geller, MD [facility] at [facility]   "

## 2024-03-09 NOTE — Assessment & Plan Note (Signed)
 Lab Results  Component Value Date   CREATININE 1.33 (H) 02/15/2024   Secondary to renal stones, Improved.  Avoid Aleve , Advil, ibuprofen, motrin and other similar ones. Preferably take Tylenol  if needed for pain.

## 2024-03-09 NOTE — Assessment & Plan Note (Signed)
 BP Readings from Last 3 Encounters:  03/09/24 106/78  02/22/24 129/86  02/10/24 (!) 160/106   Well controlled. Lab Results  Component Value Date   CREATININE 1.33 (H) 02/15/2024   Improved. CCM. Refill sent.

## 2024-03-21 ENCOUNTER — Encounter
Admission: RE | Admit: 2024-03-21 | Discharge: 2024-03-21 | Disposition: A | Discharge status: Home / Self Care | Source: Ambulatory Visit | Attending: Urology | Admitting: Urology

## 2024-03-21 ENCOUNTER — Other Ambulatory Visit: Payer: Self-pay

## 2024-03-21 HISTORY — DX: Personal history of urinary calculi: Z87.442

## 2024-03-21 HISTORY — DX: Chronic kidney disease, unspecified: N18.9

## 2024-03-21 NOTE — Patient Instructions (Addendum)
 Your procedure is scheduled on:  MONDAY JANUARY 26  Report to the Registration Desk on the 1st floor of the [facility] To find out your arrival time, please call [phone] between 1PM - 3PM on:  FRIDAY JANUARY 23 If your arrival time is 6:00 am, do not arrive before that time as the Medical Mall entrance doors do not open until 6:00 am.  REMEMBER: Instructions that are not followed completely may result in serious medical risk, up to and including death; or upon the discretion of your surgeon and anesthesiologist your surgery may need to be rescheduled.  Do not eat food after midnight the night before surgery.  No gum chewing or hard candies.   One week prior to surgery: Stop Anti-inflammatories (NSAIDS) such as Advil, Aleve , Ibuprofen, Motrin, Naproxen , Naprosyn  and Aspirin based products such as Excedrin, Goody's Powder, BC Powder. Stop ANY OVER THE COUNTER supplements until after surgery.  You may however, continue to take Tylenol  if needed for pain up until the day of surgery.  Continue taking all of your other prescription medications up until the day of surgery.  ON THE MORNING OF SURGERY DO NOT TAKE ANY MEDICATIONS  No Alcohol for 24 hours before or after surgery.  Do not use any recreational drugs for at least a week (preferably 2 weeks) before your surgery.  Please be advised that the combination of cocaine and anesthesia may have negative outcomes, up to and including death. If you test positive for cocaine, your surgery will be cancelled.  On the morning of surgery brush your teeth with toothpaste and water , you may rinse your mouth with mouthwash if you wish. Do not swallow any toothpaste or mouthwash.  Do not wear jewelry, make-up, hairpins, clips or nail polish.  For welded (permanent) jewelry: bracelets, anklets, waist bands, etc.  Please have this removed prior to surgery.  If it is not removed, there is a chance that hospital personnel will need to cut it  off on the day of surgery.  Do not wear lotions, powders, or perfumes.   Do not shave body hair from the neck down 48 hours before surgery.  Contact lenses, hearing aids and dentures may not be worn into surgery.  Do not bring valuables to the hospital. [facility] is not responsible for any missing/lost belongings or valuables.   Notify your doctor if there is any change in your medical condition (cold, fever, infection).  Wear comfortable clothing (specific to your surgery type) to the hospital.  After surgery, you can help prevent lung complications by doing breathing exercises.  Take deep breaths and cough every 1-2 hours.   If you are being discharged the day of surgery, you will not be allowed to drive home. You will need a responsible individual to drive you home and stay with you for 24 hours after surgery.   If you are taking public transportation, you will need to have a responsible individual with you.  Please call the Pre-admissions Testing Dept. at [phone] if you have any questions about these instructions.  Surgery Visitation Policy:  Patients having surgery or a procedure may have two visitors.  Children under the age of 21 must have an adult with them who is not the patient.  Merchandiser, Retail to address health-related social needs:  [email or web address]

## 2024-03-28 ENCOUNTER — Ambulatory Visit: Admission: RE | Admit: 2024-03-28 | Discharge: 2024-03-28 | Disposition: A | Attending: Urology | Admitting: Urology

## 2024-03-28 ENCOUNTER — Ambulatory Visit

## 2024-03-28 ENCOUNTER — Ambulatory Visit: Admitting: Certified Registered"

## 2024-03-28 ENCOUNTER — Other Ambulatory Visit: Payer: Self-pay

## 2024-03-28 ENCOUNTER — Encounter: Payer: Self-pay | Admitting: Urology

## 2024-03-28 ENCOUNTER — Encounter
Admission: RE | Disposition: A | Discharge status: Home / Self Care | Payer: Self-pay | Source: Home / Self Care | Attending: Urology

## 2024-03-28 DIAGNOSIS — N201 Calculus of ureter: Secondary | ICD-10-CM | POA: Diagnosis present

## 2024-03-28 DIAGNOSIS — I129 Hypertensive chronic kidney disease with stage 1 through stage 4 chronic kidney disease, or unspecified chronic kidney disease: Secondary | ICD-10-CM | POA: Diagnosis not present

## 2024-03-28 DIAGNOSIS — N132 Hydronephrosis with renal and ureteral calculous obstruction: Secondary | ICD-10-CM | POA: Diagnosis not present

## 2024-03-28 DIAGNOSIS — N1831 Chronic kidney disease, stage 3a: Secondary | ICD-10-CM | POA: Insufficient documentation

## 2024-03-28 DIAGNOSIS — K219 Gastro-esophageal reflux disease without esophagitis: Secondary | ICD-10-CM | POA: Diagnosis not present

## 2024-03-28 DIAGNOSIS — N202 Calculus of kidney with calculus of ureter: Secondary | ICD-10-CM | POA: Diagnosis not present

## 2024-03-28 DIAGNOSIS — Z87442 Personal history of urinary calculi: Secondary | ICD-10-CM | POA: Insufficient documentation

## 2024-03-28 MED ORDER — ONDANSETRON HCL 4 MG/2ML IJ SOLN
INTRAMUSCULAR | Status: AC
  Filled 2024-03-28: qty 2

## 2024-03-28 MED ORDER — CHLORHEXIDINE GLUCONATE 0.12 % MT SOLN
OROMUCOSAL | Status: AC
  Filled 2024-03-28: qty 15

## 2024-03-28 MED ORDER — CHLORHEXIDINE GLUCONATE 0.12 % MT SOLN
15.0000 mL | Freq: Once | OROMUCOSAL | Status: AC
  Administered 2024-03-28: 15 mL via OROMUCOSAL

## 2024-03-28 MED ORDER — ONDANSETRON HCL 4 MG/2ML IJ SOLN
INTRAMUSCULAR | Status: DC | PRN
  Administered 2024-03-28: 4 mg via INTRAVENOUS

## 2024-03-28 MED ORDER — STERILE WATER FOR IRRIGATION IR SOLN
Status: DC | PRN
  Administered 2024-03-28: 500 mL

## 2024-03-28 MED ORDER — OXYCODONE HCL 5 MG PO TABS
5.0000 mg | ORAL_TABLET | Freq: Once | ORAL | Status: AC | PRN
  Administered 2024-03-28: 5 mg via ORAL

## 2024-03-28 MED ORDER — FENTANYL CITRATE (PF) 100 MCG/2ML IJ SOLN
INTRAMUSCULAR | Status: AC
  Filled 2024-03-28: qty 2

## 2024-03-28 MED ORDER — ROCURONIUM BROMIDE 100 MG/10ML IV SOLN
INTRAVENOUS | Status: DC | PRN
  Administered 2024-03-28: 20 mg via INTRAVENOUS
  Administered 2024-03-28: 50 mg via INTRAVENOUS

## 2024-03-28 MED ORDER — CIPROFLOXACIN HCL 500 MG PO TABS
500.0000 mg | ORAL_TABLET | Freq: Every day | ORAL | 0 refills | Status: DC
  Filled 2024-03-28: qty 14, 14d supply, fill #0

## 2024-03-28 MED ORDER — PROPOFOL 10 MG/ML IV BOLUS
INTRAVENOUS | Status: AC
  Filled 2024-03-28: qty 20

## 2024-03-28 MED ORDER — LIDOCAINE HCL (PF) 2 % IJ SOLN
INTRAMUSCULAR | Status: AC
  Filled 2024-03-28: qty 5

## 2024-03-28 MED ORDER — SODIUM CHLORIDE 0.9 % IR SOLN
Status: DC | PRN
  Administered 2024-03-28 (×2): 3000 mL

## 2024-03-28 MED ORDER — OXYCODONE HCL 5 MG/5ML PO SOLN: 5.0000 mg | Freq: Once | ORAL | Status: AC | PRN

## 2024-03-28 MED ORDER — OXYBUTYNIN CHLORIDE 5 MG PO TABS
ORAL_TABLET | ORAL | Status: AC
  Filled 2024-03-28: qty 1

## 2024-03-28 MED ORDER — OXYBUTYNIN CHLORIDE 5 MG PO TABS
5.0000 mg | ORAL_TABLET | Freq: Once | ORAL | Status: AC
  Administered 2024-03-28: 5 mg via ORAL

## 2024-03-28 MED ORDER — MIDAZOLAM HCL 2 MG/2ML IJ SOLN
INTRAMUSCULAR | Status: AC
  Filled 2024-03-28: qty 2

## 2024-03-28 MED ORDER — LACTATED RINGERS IV SOLN: INTRAVENOUS | Status: DC

## 2024-03-28 MED ORDER — CIPROFLOXACIN IN D5W 400 MG/200ML IV SOLN
INTRAVENOUS | Status: AC
  Filled 2024-03-28: qty 200

## 2024-03-28 MED ORDER — OXYCODONE HCL 5 MG PO TABS
ORAL_TABLET | ORAL | Status: AC
  Filled 2024-03-28: qty 1

## 2024-03-28 MED ORDER — DEXAMETHASONE SOD PHOSPHATE PF 10 MG/ML IJ SOLN
INTRAMUSCULAR | Status: DC | PRN
  Administered 2024-03-28: 10 mg via INTRAVENOUS

## 2024-03-28 MED ORDER — HYDROCODONE-ACETAMINOPHEN 5-325 MG PO TABS
1.0000 | ORAL_TABLET | Freq: Four times a day (QID) | ORAL | 0 refills | Status: AC | PRN
  Filled 2024-03-28: qty 8, 2d supply, fill #0

## 2024-03-28 MED ORDER — LIDOCAINE HCL (CARDIAC) PF 100 MG/5ML IV SOSY
PREFILLED_SYRINGE | INTRAVENOUS | Status: DC | PRN
  Administered 2024-03-28: 100 mg via INTRAVENOUS

## 2024-03-28 MED ORDER — ROCURONIUM BROMIDE 10 MG/ML (PF) SYRINGE
PREFILLED_SYRINGE | INTRAVENOUS | Status: AC
  Filled 2024-03-28: qty 10

## 2024-03-28 MED ORDER — CEFAZOLIN SODIUM-DEXTROSE 2-4 GM/100ML-% IV SOLN: 2.0000 g | INTRAVENOUS | Status: DC

## 2024-03-28 MED ORDER — CEFAZOLIN SODIUM-DEXTROSE 2-4 GM/100ML-% IV SOLN
INTRAVENOUS | Status: AC
  Filled 2024-03-28: qty 100

## 2024-03-28 MED ORDER — CIPROFLOXACIN IN D5W 400 MG/200ML IV SOLN
400.0000 mg | Freq: Once | INTRAVENOUS | Status: AC
  Administered 2024-03-28: 400 mg via INTRAVENOUS

## 2024-03-28 MED ORDER — ACETAMINOPHEN 10 MG/ML IV SOLN
INTRAVENOUS | Status: DC | PRN
  Administered 2024-03-28: 1000 mg via INTRAVENOUS

## 2024-03-28 MED ORDER — SUGAMMADEX SODIUM 200 MG/2ML IV SOLN
INTRAVENOUS | Status: DC | PRN
  Administered 2024-03-28: 200 mg via INTRAVENOUS

## 2024-03-28 MED ORDER — KETOROLAC TROMETHAMINE 30 MG/ML IJ SOLN
INTRAMUSCULAR | Status: DC | PRN
  Administered 2024-03-28: 15 mg via INTRAVENOUS

## 2024-03-28 MED ORDER — DEXAMETHASONE SOD PHOSPHATE PF 10 MG/ML IJ SOLN
INTRAMUSCULAR | Status: AC
  Filled 2024-03-28: qty 1

## 2024-03-28 MED ORDER — FENTANYL CITRATE (PF) 100 MCG/2ML IJ SOLN
INTRAMUSCULAR | Status: DC | PRN
  Administered 2024-03-28 (×2): 50 ug via INTRAVENOUS

## 2024-03-28 MED ORDER — KETOROLAC TROMETHAMINE 30 MG/ML IJ SOLN
INTRAMUSCULAR | Status: AC
  Filled 2024-03-28: qty 1

## 2024-03-28 MED ORDER — MIDAZOLAM HCL (PF) 2 MG/2ML IJ SOLN
INTRAMUSCULAR | Status: DC | PRN
  Administered 2024-03-28: 2 mg via INTRAVENOUS

## 2024-03-28 MED ORDER — DROPERIDOL 2.5 MG/ML IJ SOLN: 0.6250 mg | Freq: Once | INTRAMUSCULAR | Status: DC | PRN

## 2024-03-28 MED ORDER — ORAL CARE MOUTH RINSE: 15.0000 mL | Freq: Once | OROMUCOSAL | Status: AC

## 2024-03-28 MED ORDER — FENTANYL CITRATE (PF) 100 MCG/2ML IJ SOLN
25.0000 ug | INTRAMUSCULAR | Status: DC | PRN
  Administered 2024-03-28 (×2): 50 ug via INTRAVENOUS

## 2024-03-28 MED ORDER — PROPOFOL 10 MG/ML IV BOLUS
INTRAVENOUS | Status: DC | PRN
  Administered 2024-03-28: 30 mg via INTRAVENOUS
  Administered 2024-03-28: 150 mg via INTRAVENOUS

## 2024-03-28 MED ORDER — IOHEXOL 180 MG/ML  SOLN
INTRAMUSCULAR | Status: DC | PRN
  Administered 2024-03-28 (×2): 10 mL

## 2024-03-28 MED ORDER — ACETAMINOPHEN 10 MG/ML IV SOLN
INTRAVENOUS | Status: AC
  Filled 2024-03-28: qty 100

## 2024-03-28 NOTE — H&P (Signed)
 "  03/28/24 11:41 AM   Dorn ONEIDA Gilding 09/28/1970 [id number]  CC: Left ureteral stone, right renal stone  HPI: Underwent left-sided ureteroscopy, laser lithotripsy of 80% of a 2 cm left mid ureteral stone on 02/22/2024 with plan for staged management of residual left-sided stone and possible right renal stone. NM renal scan preop showed 23% function of the left kidney.  He was actually hospitalized at [facility] a week later and the stent was upsized.  Recently hospitalized again for UTI at [facility] and treated with culture appropriate antibiotics, he denies any complaints today.  Had a CT at that hospitalization with minimal remaining left-sided stone burden and stent in appropriate position, 9 mm right upper pole stone.   PMH: Past Medical History:  Diagnosis Date   Chronic kidney disease    GERD (gastroesophageal reflux disease)    History of kidney stones    Hypertension     Surgical History: Past Surgical History:  Procedure Laterality Date   COLONOSCOPY WITH PROPOFOL  N/A 06/27/2022   Procedure: COLONOSCOPY WITH PROPOFOL ;  Surgeon: Jinny Carmine, MD;  Location: [facility];  Service: Endoscopy;  Laterality: N/A;   CYSTOSCOPY/URETEROSCOPY/HOLMIUM LASER Right 02/22/2024   Procedure: CYSTOURETEROSCOPY, USING HOLMIUM LASER;  Surgeon: Francisca Redell BROCKS, MD;  Location: [facility] ORS;  Service: Urology;  Laterality: Right;   CYSTOSCOPY/URETEROSCOPY/HOLMIUM LASER/STENT PLACEMENT Left 02/22/2024   Procedure: CYSTOSCOPY/URETEROSCOPY/HOLMIUM LASER/STENT PLACEMENT;  Surgeon: Francisca Redell BROCKS, MD;  Location: [facility] OR[facility];  Service: Urology;  Laterality: Left;   POLYPECTOMY  06/27/2022   Procedure: POLYPECTOMY;  Surgeon: Jinny Carmine, MD;  Location: [facility];  Service: Endoscopy;;   THROAT SURGERY      Family History: Family History  Problem Relation Age of Onset   Stroke Mother    Cancer Mother    Hypertension Mother    Hypertension Father    Stroke Father    Diabetes Father     Heart disease Father    Hypertension Sister     Social History:  reports that he has never smoked. He has never used smokeless tobacco. He reports current alcohol use. He reports that he does not use drugs.  Physical Exam: BP 121/86   Temp (!) 97.4 F (36.3 C) (Temporal)   Resp 16   Ht 5' 9 (1.753 m)   Wt 97.1 kg   SpO2 98%   BMI 31.60 kg/m    Constitutional:  Alert and oriented, No acute distress. Cardiovascular: Regular rate and rhythm Respiratory: Clear to auscultation bilaterally GI: Abdomen is soft, nontender, nondistended, no abdominal masses   Laboratory Data: Urine culture 1/15 with 10-50k Serratia, treated with culture appropriate antibiotics  Assessment & Plan:   54 year old male who presented with a 2 cm left mid ureteral stone, left renal atrophy and hydronephrosis, renal scan showed 23% residual function and he opted for definitive treatment.  Underwent initial ureteroscopy 02/22/2024 with treatment of 80% left-sided stone burden stent placement.  He was then hospitalized at [facility] a week later and stent was upsized.  Most recent CT shows very minimal remaining left-sided stone burden.  Also with a 9 mm right upper pole stone.  We specifically discussed the risks ureteroscopy including bleeding, infection/sepsis, stent related symptoms including flank pain/urgency/frequency/incontinence/dysuria, ureteral injury, ureteral stricture, inability to access stone, or need for staged or additional procedures.  Risks and benefits of left side only versus both sides discussed extensively, he defers to IntraOp findings regarding challenges of left side and the right side easily accessible.  Left ureteroscopy, laser  lithotripsy, stent change, possible right ureteroscopy, laser lithotripsy, stent placement.  Redell Burnet, MD 03/28/2024  [facility] [address] [place], [place] [address] [phone]   "

## 2024-03-28 NOTE — Anesthesia Preprocedure Evaluation (Signed)
"                                    Anesthesia Evaluation  Patient identified by MRN, date of birth, ID band Patient awake    Reviewed: Allergy & Precautions, NPO status , Patient's Chart, lab work & pertinent test results  History of Anesthesia Complications Negative for: history of anesthetic complications  Airway Mallampati: II  TM Distance: >3 FB Neck ROM: Full    Dental  (+) Chipped, Dental Advidsory Given   Pulmonary neg pulmonary ROS   Pulmonary exam normal breath sounds clear to auscultation       Cardiovascular hypertension, Pt. on medications (-) angina (-) Past MI and (-) Cardiac Stents Normal cardiovascular exam(-) dysrhythmias (-) Valvular Problems/Murmurs Rhythm:Regular Rate:Normal     Neuro/Psych negative neurological ROS  negative psych ROS   GI/Hepatic Neg liver ROS,GERD  Medicated,,  Endo/Other  neg diabetes    Renal/GU Renal disease (CKD 3a. kidney stones)  negative genitourinary   Musculoskeletal   Abdominal  (+) + obese  Peds negative pediatric ROS (+)  Hematology negative hematology ROS (+)   Anesthesia Other Findings Past Medical History: No date: GERD (gastroesophageal reflux disease) No date: Hypertension  Past Surgical History: 06/27/2022: COLONOSCOPY WITH PROPOFOL ; N/A     Comment:  Procedure: COLONOSCOPY WITH PROPOFOL ;  Surgeon: Jinny Carmine, MD;  Location: [facility];  Service:               Endoscopy;  Laterality: N/A; 06/27/2022: POLYPECTOMY     Comment:  Procedure: POLYPECTOMY;  Surgeon: Jinny Carmine, MD;                Location: [facility];  Service: Endoscopy;; No date: THROAT SURGERY     Reproductive/Obstetrics negative OB ROS                              Anesthesia Physical Anesthesia Plan  ASA: 2  Anesthesia Plan: General   Post-op Pain Management:    Induction: Intravenous  PONV Risk Score and Plan: Ondansetron , Dexamethasone , Midazolam   and Treatment may vary due to age or medical condition  Airway Management Planned: Oral ETT  Additional Equipment:   Intra-op Plan:   Post-operative Plan: Extubation in OR  Informed Consent: I have reviewed the patients History and Physical, chart, labs and discussed the procedure including the risks, benefits and alternatives for the proposed anesthesia with the patient or authorized representative who has indicated his/her understanding and acceptance.     Dental Advisory Given  Plan Discussed with: CRNA  Anesthesia Plan Comments:          Anesthesia Quick Evaluation  "

## 2024-03-28 NOTE — Anesthesia Procedure Notes (Signed)
 Procedure Name: Intubation Date/Time: 03/28/2024 12:07 PM  Performed by: Jackye Spanner, CRNAPre-anesthesia Checklist: Patient identified, Patient being monitored, Timeout performed, Emergency Drugs available and Suction available Patient Re-evaluated:Patient Re-evaluated prior to induction Oxygen Delivery Method: Circle system utilized Preoxygenation: Pre-oxygenation with 100% oxygen Induction Type: IV induction Ventilation: Mask ventilation without difficulty, Two handed mask ventilation required and Oral airway inserted - appropriate to patient size Laryngoscope Size: 3 and McGrath Grade View: Grade I Tube type: Oral Tube size: 7.0 mm Number of attempts: 1 Airway Equipment and Method: Stylet Placement Confirmation: ETT inserted through vocal cords under direct vision, positive ETCO2 and breath sounds checked- equal and bilateral Secured at: 22 cm Tube secured with: Tape Dental Injury: Teeth and Oropharynx as per pre-operative assessment  Comments: Smooth atraumatic ETT placement, no complications noted.

## 2024-03-28 NOTE — Transfer of Care (Signed)
 Immediate Anesthesia Transfer of Care Note  Patient: Isaac [name]  Procedure(s) Performed: CYSTOSCOPY/URETEROSCOPY/HOLMIUM LASER/STENT PLACEMENT (Bilateral: Ureter)  Patient Location: PACU  Anesthesia Type:General  Level of Consciousness: awake, alert , and drowsy  Airway & Oxygen Therapy: Patient Spontanous Breathing and Patient connected to face mask oxygen  Post-op Assessment: Report given to RN and Post -op Vital signs reviewed and stable  Post vital signs: Reviewed and stable  Last Vitals:  Vitals Value Taken Time  BP 141/78 03/28/24 13:30  Temp 36.3 C 03/28/24 13:30  Pulse 65 03/28/24 13:34  Resp 17 03/28/24 13:34  SpO2 98 % 03/28/24 13:34  Vitals shown include unfiled device data.  Last Pain:  Vitals:   03/28/24 1330  TempSrc:   PainSc: Asleep         Complications: No notable events documented.

## 2024-03-28 NOTE — Op Note (Signed)
 Date of procedure: 03/28/24  Preoperative diagnosis:  Left ureteral stones Right renal stone  Postoperative diagnosis:  Same  Procedure: Cystoscopy Left ureteroscopy, laser lithotripsy of ureteral stones, left retrograde pyelogram with intraoperative interpretation, left ureteral stent placement Right ureteroscopy, laser lithotripsy of renal stone, right retrograde pyelogram with intraoperative interpretation, right ureteral stent placement  Surgeon: Redell Burnet, MD  Anesthesia: General  Complications: None  Intraoperative findings:  Normal cystoscopy Moderate remaining left ureteral stone burden dusted and fragments irrigated free, no evidence of ureteral stricture Right renal stone dusted Uncomplicated bilateral stent placement  EBL: Minimal  Specimens: Stone for analysis  Drains: Bilateral 58F by 26 cm ureteral stents  Indication: Isaac [name] is a 54 y.o. patient with large 2 cm left mid ureteral stone and hydronephrosis with 23% function on renal scan who opted for ureteroscopy.  Underwent for stage left-sided ureteroscopy 02/22/2024, has had multiple hospitalizations at [facility] since that time, including upsizing of the stent, most recent CT does show some minimal to moderate remaining left-sided ureteral stone burden, and stable 9 mm right upper pole stone.  After reviewing the management options for treatment, they elected to proceed with the above surgical procedure(s). We have discussed the potential benefits and risks of the procedure, side effects of the proposed treatment, the likelihood of the patient achieving the goals of the procedure, and any potential problems that might occur during the procedure or recuperation. Informed consent has been obtained.  Description of procedure:  The patient was taken to the operating room and general anesthesia was induced. SCDs were placed for DVT prophylaxis. The patient was placed in the dorsal lithotomy position, prepped  and draped in the usual sterile fashion, and preoperative antibiotics(Cipro ) were administered. A preoperative time-out was performed.   A 21 French rigid cystoscope was used to intubate the urethra and followed proximally into the bladder.  The prostate was small.  No abnormal findings within the bladder.  A wire was passed alongside the left ureteral stent up to the kidney under fluoroscopic vision, and the old stent removed.  A semirigid long ureteroscope was advanced alongside the wire and there were multiple stone fragments in the distal and mid ureter measuring between 1 mm and 6 mm.  A 365 m laser fiber on settings of 1.0 J and 10 Hz were used to methodically fragment the stones to dust smaller than the laser fiber.  Multiple passes were made up and down the ureter to be sure that all fragments had been dusted.  Numerous fragments were irrigated free from the ureter.  A retrograde pyelogram was performed from the proximal ureter and showed no extravasation or filling defects.  A rigid cystoscope was backloaded over the wire and a 7 French by 26 cm ureteral stent was uneventfully placed with a curl in the kidney as well as in the bladder.  I then turned my attention to the right side and a sensor wire advanced easily to the right kidney under fluoroscopic vision.  A digital single-channel flexible ureteroscope was advanced over the wire fluoroscopically up to the kidney.  There was a 9 mm black calcium  oxalate appearing stone in the upper pole.  The laser fiber on settings of 0.8 J and 80 Hz were used to methodically dust the stones, and all fragments appeared to be smaller than the laser fiber.  Pyeloscopy showed no other significant stones.  Retrograde pyelogram showed no extravasation or filling defects.  The wire was replaced through the scope.  A rigid  cystoscope was backloaded over the wire and a 7 French by 26 cm ureteral stent was placed uneventfully with a curl in the kidney as well as in the  bladder under direct vision.  The bladder was drained and this concluded our procedure.  Disposition: Stable to PACU  Plan: -Keep follow-up as scheduled next week for stent removal, Cipro  low-dose daily prophylaxis -Their insurance has changed and they plan follow up with [facility] urology in the future, recommended a 6 to 8-week ultrasound post ureteroscopy  Redell Burnet, MD

## 2024-03-29 ENCOUNTER — Encounter: Payer: Self-pay | Admitting: Urology

## 2024-03-29 NOTE — Anesthesia Postprocedure Evaluation (Signed)
"   Anesthesia Post Note  Patient: Isaac [name]  Procedure(s) Performed: CYSTOSCOPY/URETEROSCOPY/HOLMIUM LASER/STENT PLACEMENT (Bilateral: Ureter)  Patient location during evaluation: PACU Anesthesia Type: General Level of consciousness: awake and alert Pain management: pain level controlled Vital Signs Assessment: post-procedure vital signs reviewed and stable Respiratory status: spontaneous breathing, nonlabored ventilation and respiratory function stable Cardiovascular status: blood pressure returned to baseline and stable Postop Assessment: no apparent nausea or vomiting Anesthetic complications: no   No notable events documented.   Last Vitals:  Vitals:   03/28/24 1435 03/28/24 1441  BP:  (!) 147/82  Pulse: 77   Resp: 18   Temp: 36.8 C   SpO2: 98%     Last Pain:  Vitals:   03/28/24 1435  TempSrc: Temporal  PainSc: 4                  Camellia Merilee Louder      "

## 2024-04-06 ENCOUNTER — Ambulatory Visit (INDEPENDENT_AMBULATORY_CARE_PROVIDER_SITE_OTHER): Admitting: Urology

## 2024-04-06 VITALS — BP 116/76 | HR 107 | Ht 69.0 in | Wt 214.0 lb

## 2024-04-06 DIAGNOSIS — Z466 Encounter for fitting and adjustment of urinary device: Secondary | ICD-10-CM

## 2024-04-06 MED ORDER — CIPROFLOXACIN HCL 500 MG PO TABS
500.0000 mg | ORAL_TABLET | Freq: Once | ORAL | Status: AC
  Administered 2024-04-06: 500 mg via ORAL

## 2024-04-06 MED ORDER — LIDOCAINE HCL URETHRAL/MUCOSAL 2 % EX GEL
1.0000 | Freq: Once | CUTANEOUS | Status: AC
  Administered 2024-04-06: 1 via URETHRAL

## 2024-04-06 NOTE — Patient Instructions (Signed)
 Continue the Flomax for another 2 weeks, this will help small stone fragments/dust/gravel pass  If you have fever over 101.3 please call the clinic or present to the ER.  If you have severe flank pain or groin pain, take an anti-inflammatory like Aleve  or ibuprofen, this can be caused by ureteral spasm after the stent is removed  Follow-up at [facility] in the next 8 to 10 weeks for an ultrasound of your kidneys, you may always have some swelling(hydronephrosis) on the left side where the stone had been stuck long-term

## 2024-04-06 NOTE — Progress Notes (Signed)
 Cystoscopy Procedure Note:  Indication: Stent removal s/p 03/28/2024 bilateral ureteroscopy  Initially presented with large 2 cm impacted left mid ureteral stone and hydronephrosis, 23% residual function on renal scan, as well as a 8 mm right upper pole stone.  Underwent staged treatment with initial ureteroscopy treating 80% of left-sided large stone 02/22/2024, stent was actually upsized at [facility] on 02/27/2024, and underwent second stage bilateral ureteroscopy 03/28/2024.  Cipro  given for prophylaxis  After informed consent and discussion of the procedure and its risks, Isaac [name] was positioned and prepped in the standard fashion. Cystoscopy was performed with a flexible cystoscope. The stent was grasped with flexible graspers and removed in its entirety.  Scope was reinserted and the contralateral stent removed  Findings: Uncomplicated stent removal  Assessment and Plan: -His insurance has changed and he will follow-up with [facility] urology -Continue Flomax x 2 weeks -I stressed the importance of a follow-up renal ultrasound in 8 to 10 weeks for silent hydronephrosis, may have a component of chronic left-sided hydronephrosis in the setting of longstanding obstruction.  Risk of stricture, obstructive fragments, infection were discussed.   Isaac JAYSON Burnet, MD 04/06/2024

## 2024-04-07 ENCOUNTER — Ambulatory Visit: Payer: Self-pay | Admitting: Urology

## 2024-04-07 LAB — STONE ANALYSIS
Calcium Oxalate Monohydrate: 100 %
Weight Calculi: 16 mg

## 2024-04-07 NOTE — Addendum Note (Signed)
 Addendum  created 04/07/24 0823 by Vicci Camellia Glatter, MD   Attestation recorded in Intraprocedure, Intraprocedure Attestations deleted, Intraprocedure Attestations filed
